# Patient Record
Sex: Male | Born: 1955 | Race: White | Hispanic: No | Marital: Married | Smoking: Never smoker
Health system: Southern US, Community
[De-identification: ages and names within clinical notes are randomized; demographics above are authoritative.]

## PROBLEM LIST (undated history)

## (undated) DIAGNOSIS — I493 Ventricular premature depolarization: Secondary | ICD-10-CM

## (undated) DIAGNOSIS — I10 Essential (primary) hypertension: Secondary | ICD-10-CM

## (undated) DIAGNOSIS — M810 Age-related osteoporosis without current pathological fracture: Secondary | ICD-10-CM

## (undated) HISTORY — DX: Ventricular premature depolarization: I49.3

## (undated) HISTORY — DX: Essential (primary) hypertension: I10

## (undated) HISTORY — DX: Age-related osteoporosis without current pathological fracture: M81.0

---
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
## 2008-07-26 ENCOUNTER — Ambulatory Visit: Payer: Self-pay | Admitting: Gastroenterology

## 2016-04-23 ENCOUNTER — Other Ambulatory Visit: Payer: Self-pay | Admitting: Orthopedic Surgery

## 2016-04-23 DIAGNOSIS — M5416 Radiculopathy, lumbar region: Secondary | ICD-10-CM

## 2016-04-25 ENCOUNTER — Ambulatory Visit
Admission: RE | Admit: 2016-04-25 | Discharge: 2016-04-25 | Disposition: A | Discharge status: Home / Self Care | Payer: BLUE CROSS/BLUE SHIELD | Source: Ambulatory Visit | Attending: Orthopedic Surgery | Admitting: Orthopedic Surgery

## 2016-04-25 DIAGNOSIS — M5126 Other intervertebral disc displacement, lumbar region: Secondary | ICD-10-CM | POA: Insufficient documentation

## 2016-04-25 DIAGNOSIS — M5416 Radiculopathy, lumbar region: Secondary | ICD-10-CM | POA: Diagnosis present

## 2017-04-09 ENCOUNTER — Other Ambulatory Visit: Payer: Self-pay

## 2017-04-09 DIAGNOSIS — Z0000 Encounter for general adult medical examination without abnormal findings: Secondary | ICD-10-CM

## 2017-04-09 LAB — POCT URINALYSIS DIPSTICK
Bilirubin, UA: NEGATIVE
Blood, UA: NEGATIVE
GLUCOSE UA: NEGATIVE
Ketones, UA: NEGATIVE
Leukocytes, UA: NEGATIVE
Nitrite, UA: NEGATIVE
Spec Grav, UA: 1.015 (ref 1.010–1.025)
UROBILINOGEN UA: 0.2 U/dL
pH, UA: 6.5 (ref 5.0–8.0)

## 2017-04-11 LAB — CMP12+LP+TP+TSH+6AC+PSA+CBC…
A/G RATIO: 1.9 (ref 1.2–2.2)
ALBUMIN: 4.4 g/dL (ref 3.6–4.8)
ALK PHOS: 72 IU/L (ref 39–117)
ALT: 17 IU/L (ref 0–44)
AST: 19 IU/L (ref 0–40)
BASOS ABS: 0.1 10*3/uL (ref 0.0–0.2)
BUN/Creatinine Ratio: 17 (ref 10–24)
BUN: 17 mg/dL (ref 8–27)
Basos: 1 %
Bilirubin Total: 1.7 mg/dL — ABNORMAL HIGH (ref 0.0–1.2)
CALCIUM: 9.4 mg/dL (ref 8.6–10.2)
CHOLESTEROL TOTAL: 152 mg/dL (ref 100–199)
Chloride: 102 mmol/L (ref 96–106)
Chol/HDL Ratio: 3.4 ratio (ref 0.0–5.0)
Creatinine, Ser: 0.99 mg/dL (ref 0.76–1.27)
EOS (ABSOLUTE): 0.2 10*3/uL (ref 0.0–0.4)
Eos: 3 %
Estimated CHD Risk: 0.5 times avg. (ref 0.0–1.0)
Free Thyroxine Index: 2.3 (ref 1.2–4.9)
GFR calc Af Amer: 94 mL/min/{1.73_m2} (ref >59)
GFR calc non Af Amer: 81 mL/min/{1.73_m2} (ref >59)
GGT: 15 IU/L (ref 0–65)
GLOBULIN, TOTAL: 2.3 g/dL (ref 1.5–4.5)
Glucose: 113 mg/dL — ABNORMAL HIGH (ref 65–99)
HDL: 45 mg/dL (ref >39)
HEMOGLOBIN: 16.1 g/dL (ref 13.0–17.7)
Hematocrit: 48 % (ref 37.5–51.0)
IMMATURE GRANULOCYTES: 0 %
Immature Grans (Abs): 0 10*3/uL (ref 0.0–0.1)
Iron: 118 ug/dL (ref 38–169)
LDH: 159 IU/L (ref 121–224)
LDL CALC: 92 mg/dL (ref 0–99)
LYMPHS ABS: 1.6 10*3/uL (ref 0.7–3.1)
Lymphs: 25 %
MCH: 31.1 pg (ref 26.6–33.0)
MCHC: 33.5 g/dL (ref 31.5–35.7)
MCV: 93 fL (ref 79–97)
MONOS ABS: 0.7 10*3/uL (ref 0.1–0.9)
Monocytes: 10 %
NEUTROS PCT: 61 %
Neutrophils Absolute: 4 10*3/uL (ref 1.4–7.0)
PLATELETS: 271 10*3/uL (ref 150–379)
PROSTATE SPECIFIC AG, SERUM: 2.3 ng/mL (ref 0.0–4.0)
Phosphorus: 3.3 mg/dL (ref 2.5–4.5)
Potassium: 4.5 mmol/L (ref 3.5–5.2)
RBC: 5.18 x10E6/uL (ref 4.14–5.80)
RDW: 13.7 % (ref 12.3–15.4)
Sodium: 139 mmol/L (ref 134–144)
T3 Uptake Ratio: 30 % (ref 24–39)
T4 TOTAL: 7.7 ug/dL (ref 4.5–12.0)
TRIGLYCERIDES: 75 mg/dL (ref 0–149)
TSH: 1.65 u[IU]/mL (ref 0.450–4.500)
Total Protein: 6.7 g/dL (ref 6.0–8.5)
Uric Acid: 5.1 mg/dL (ref 3.7–8.6)
VLDL Cholesterol Cal: 15 mg/dL (ref 5–40)
WBC: 6.6 10*3/uL (ref 3.4–10.8)

## 2017-04-11 LAB — VITAMIN D 25 HYDROXY (VIT D DEFICIENCY, FRACTURES): Vit D, 25-Hydroxy: 37.8 ng/mL (ref 30.0–100.0)

## 2017-04-23 ENCOUNTER — Encounter: Payer: Self-pay | Admitting: Medical

## 2017-04-23 ENCOUNTER — Ambulatory Visit: Payer: Self-pay | Admitting: Medical

## 2017-04-23 VITALS — BP 145/85 | HR 61 | Temp 98.1°F | Resp 16 | Ht 72.0 in | Wt 172.0 lb

## 2017-04-23 DIAGNOSIS — R17 Unspecified jaundice: Secondary | ICD-10-CM

## 2017-04-23 DIAGNOSIS — Z0000 Encounter for general adult medical examination without abnormal findings: Secondary | ICD-10-CM

## 2017-04-23 DIAGNOSIS — N5089 Other specified disorders of the male genital organs: Secondary | ICD-10-CM

## 2017-04-23 DIAGNOSIS — R7301 Impaired fasting glucose: Secondary | ICD-10-CM

## 2017-04-23 NOTE — Patient Instructions (Addendum)
Schedule eye exam,  Take  Vit  D3 2000IU /day recheck in  3-6 months. Needs A1 C checked.Labs in 4 weeks for liver.      Vitamin D Deficiency Vitamin D deficiency is when your body does not have enough vitamin D. Vitamin D is important because:  It helps your body use other minerals that your body needs.  It helps keep your bones strong and healthy.  It may help to prevent some diseases.  It helps your heart and other muscles work well.  You can get vitamin D by:  Eating foods with vitamin D in them.  Drinking or eating milk or other foods that have had vitamin D added to them.  Taking a vitamin D supplement.  Being in the sun.  Not getting enough vitamin D can make your bones become soft. It can also cause other health problems. Follow these instructions at home:  Take medicines and supplements only as told by your doctor.  Eat foods that have vitamin D. These include: ? Dairy products, cereals, or juices with added vitamin D. Check the label for vitamin D. ? Fatty fish like salmon or trout. ? Eggs. ? Oysters.  Do not use tanning beds.  Stay at a healthy weight. Lose weight, if needed.  Keep all follow-up visits as told by your doctor. This is important. Contact a doctor if:  Your symptoms do not go away.  You feel sick to your stomach (nauseous).  Youthrow up (vomit).  You poop less often than usual or you have trouble pooping (constipation). This information is not intended to replace advice given to you by your health care provider. Make sure you discuss any questions you have with your health care provider. Document Released: 08/30/2011 Document Revised: 02/16/2016 Document Reviewed: 01/26/2015 Elsevier Interactive Patient Education  [employer].

## 2017-04-23 NOTE — Progress Notes (Signed)
Subjective:    Patient ID: Johnny Palmer, male    DOB: 12-Jun-1956, 61 y.o.   MRN: [id number]  HPI 61 yo male here for primary care appointment. 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   HENT: Negative.   Eyes: Negative.   Respiratory: Negative.   Cardiovascular: Negative.   Gastrointestinal: Negative.   Endocrine: Negative.   Genitourinary: Negative.   Musculoskeletal: Negative.   Skin: Negative.   Allergic/Immunologic: Positive for environmental allergies.  Neurological: Negative.   Hematological: Bruises/bleeds easily.  Psychiatric/Behavioral: Negative.    2 wks in the spring has allergy symptoms.  Current Outpatient Prescriptions:  .  aspirin 81 MG chewable tablet, Chew by mouth daily., Disp: , Rfl:  .  calcium carbonate (OSCAL) 1500 (600 Ca) MG TABS tablet, Take by mouth 2 (two) times daily with a meal., Disp: , Rfl:  .  folic acid (FOLVITE) 1 MG tablet, Take 1 mg by mouth daily., Disp: , Rfl:     Objective:   Physical Exam  Constitutional: He is oriented to person, place, and time. Vital signs are normal. He appears well-developed and well-nourished.  HENT:  Head: Normocephalic and atraumatic.  Right Ear: Hearing, tympanic membrane, external ear and ear canal normal.  Left Ear: Hearing, tympanic membrane, external ear and ear canal normal.  Nose: Nose normal.  Mouth/Throat: Uvula is midline, oropharynx is clear and moist and mucous membranes are normal.  Eyes: Pupils are equal, round, and reactive to light. Conjunctivae, EOM and lids are normal.  Fundoscopic exam:      The right eye shows red reflex.       The left eye shows red reflex.  Neck: Trachea normal and normal range of motion. Neck supple. Carotid bruit is not present. No thyromegaly present.  Cardiovascular: Normal rate, regular rhythm, normal heart sounds and intact distal pulses.  Exam reveals no gallop and  no friction rub.   No murmur heard. Pulses:      Carotid pulses are 2+ on the right side, and 2+ on the left side.      Radial pulses are 2+ on the right side, and 2+ on the left side.       Popliteal pulses are 2+ on the right side, and 2+ on the left side.       Dorsalis pedis pulses are 2+ on the right side, and 2+ on the left side.       Posterior tibial pulses are 2+ on the right side, and 2+ on the left side.  Pulmonary/Chest: Effort normal and breath sounds normal.  Abdominal: Soft. Normal aorta and bowel sounds are normal. There is no hepatosplenomegaly. Hernia confirmed negative in the right inguinal area and confirmed negative in the left inguinal area.  Genitourinary: Penis normal.    Circumcised.  Lymphadenopathy:    He has no cervical adenopathy.       Right: No inguinal, no supraclavicular and no epitrochlear adenopathy present.       Left: No inguinal, no supraclavicular and no epitrochlear adenopathy present.  Neurological: He is alert and oriented to person, place, and time. He has normal strength. No cranial nerve deficit or sensory deficit. He displays a negative Romberg sign. GCS eye subscore is 4. GCS verbal subscore is 5. GCS motor subscore is 6.  Reflex Scores:      Brachioradialis reflexes are 2+ on the right side and 2+ on  the left side.      Patellar reflexes are 2+ on the right side and 2+ on the left side.      Achilles reflexes are 2+ on the right side and 2+ on the left side. Skin: Skin is warm, dry and intact.  Psychiatric: He has a normal mood and affect. His speech is normal and behavior is normal. Judgment and thought content normal. Cognition and memory are normal.  Nursing note and vitals reviewed.  Right testicle smaller than left. Due for  colonoscopey at  43 yold Td needed from [facility] Billirubin recheck, A1C ahnd Hep c and HIV to be checked. Protein in urine recheck in 6 months      Assessment & Plan:  Primary care appointment- reviewed  labs with patient. Today recheck of Bilirubin due to elevation, A1C due to elevated fasting glucose, check  Hep C and HIV. Urine protein, recheck in  6 months. Will discuss right testicle size with Dr. Rosanna Randy. Recommend Vit D3  2000 IU/day and recheck in 3 months.

## 2017-05-21 ENCOUNTER — Other Ambulatory Visit: Payer: Self-pay

## 2017-05-21 DIAGNOSIS — Z0000 Encounter for general adult medical examination without abnormal findings: Secondary | ICD-10-CM

## 2017-05-21 DIAGNOSIS — R17 Unspecified jaundice: Secondary | ICD-10-CM

## 2017-05-21 DIAGNOSIS — R7301 Impaired fasting glucose: Secondary | ICD-10-CM

## 2017-05-22 LAB — HGB A1C W/O EAG: Hgb A1c MFr Bld: 5.8 % — ABNORMAL HIGH (ref 4.8–5.6)

## 2017-05-22 LAB — HEPATIC FUNCTION PANEL
ALBUMIN: 4.2 g/dL (ref 3.6–4.8)
ALT: 17 IU/L (ref 0–44)
AST: 16 IU/L (ref 0–40)
Alkaline Phosphatase: 71 IU/L (ref 39–117)
Bilirubin Total: 1.8 mg/dL — ABNORMAL HIGH (ref 0.0–1.2)
Bilirubin, Direct: 0.34 mg/dL (ref 0.00–0.40)
Total Protein: 6.5 g/dL (ref 6.0–8.5)

## 2017-05-22 LAB — HIV ANTIBODY (ROUTINE TESTING W REFLEX): HIV SCREEN 4TH GENERATION: NONREACTIVE

## 2017-05-22 LAB — HCV COMMENT:

## 2017-05-22 LAB — HEPATITIS C ANTIBODY (REFLEX)

## 2017-05-23 ENCOUNTER — Telehealth: Payer: Self-pay | Admitting: Medical

## 2017-05-23 DIAGNOSIS — R17 Unspecified jaundice: Secondary | ICD-10-CM

## 2017-05-23 DIAGNOSIS — E559 Vitamin D deficiency, unspecified: Secondary | ICD-10-CM

## 2017-05-23 DIAGNOSIS — R7303 Prediabetes: Secondary | ICD-10-CM

## 2017-05-23 DIAGNOSIS — N5089 Other specified disorders of the male genital organs: Secondary | ICD-10-CM

## 2017-05-23 NOTE — Telephone Encounter (Signed)
Patient returned my call to review labs.  A1C  5.8 prediabetic will schedule appointment with dietitian.  Bili 1.8  Will recheck hepatic panel in  3 months.  Patient taking  Vit D3 2000IU / d will recheck in  3 months.   Reviewed right testicle size with Dr. Rosanna Randy, smalller than the left recommend  Testicular U/S.

## 2017-05-26 ENCOUNTER — Encounter: Payer: Self-pay | Admitting: Medical

## 2017-05-27 NOTE — Telephone Encounter (Signed)
  Reviewed labs HIV/ Hep C negative, Predabetic at 5.8 will refer to [facility] May for diet counseling.  Reviewed with Dr. Rosanna Randy  Bilirubin 1.8   recheck in 3 months Will also check Vit D level he is currently now taking 2000IU/day.   Reviewed with Dr. Rosanna Randy testicle size of the right  (smaller than the left side)  Recommends testicular ultrasound.   Patient states Thursdays or Fridays work best for him for an appointment.  As side a note patientt tried Vit D3 and felt sick so he stopped it , he then felt he had like a 24 hour "flu". After he felt better he restarted the Vitamin D and has had no problems since.

## 2017-05-28 ENCOUNTER — Telehealth: Payer: Self-pay | Admitting: Medical

## 2017-05-28 DIAGNOSIS — N5089 Other specified disorders of the male genital organs: Secondary | ICD-10-CM

## 2017-05-28 NOTE — Telephone Encounter (Signed)
Reordered test [place] arat/ven flow abd/pelv doppler  img [id number] for testicular u/s. Cancelled original test.

## 2017-05-30 ENCOUNTER — Ambulatory Visit: Payer: BLUE CROSS/BLUE SHIELD

## 2017-06-05 ENCOUNTER — Ambulatory Visit
Admission: RE | Admit: 2017-06-05 | Discharge: 2017-06-05 | Disposition: A | Discharge status: Home / Self Care | Payer: BLUE CROSS/BLUE SHIELD | Source: Ambulatory Visit | Attending: Medical | Admitting: Medical

## 2017-06-05 DIAGNOSIS — N503 Cyst of epididymis: Secondary | ICD-10-CM | POA: Insufficient documentation

## 2017-06-05 DIAGNOSIS — N5089 Other specified disorders of the male genital organs: Secondary | ICD-10-CM | POA: Diagnosis not present

## 2017-06-05 DIAGNOSIS — N433 Hydrocele, unspecified: Secondary | ICD-10-CM | POA: Insufficient documentation

## 2017-06-11 ENCOUNTER — Telehealth: Payer: Self-pay | Admitting: Medical

## 2017-06-11 NOTE — Telephone Encounter (Signed)
Called patient on 06/10/17 at 5:15pm to review testicular ultrasound reports. All within normal limits.  Reviewed epididymal cysts and hydroceles with patient , small and nothing to do about them now. If he has any problems/pain or concerns to return to the clinic.   He stated he has started taking Vitamin D 3 and is feeling great.

## 2017-06-12 ENCOUNTER — Ambulatory Visit: Payer: Self-pay | Admitting: Dietician

## 2017-06-12 NOTE — Progress Notes (Unsigned)
Nutrition: 06/12/2017  CC: Had blood work done in June and has an elevated glucose test that made me in the range of Pre-diabetes.  Assessment:  HT 5;11'' WT: 184 lbs. A1C: 5.8% Fasting Glucose: 113.  Gives history of having a herniated disc approximately 12-18 months ago.  Has not been as physically active.  Has started back at stretching exercises and is back at his recumbent bike.  Trying to take it easy and not re-injury himself.  Currently is at 6 miles as many days per week as possible.  Rides in his home. B: Granola with 2% milk and a 6 oz. Glass of orange juice. L: Leftovers from home and a diet soda.   On Wednesday will have the soup/salas and bread with cobbler and ice cream.  Typically does not eat desserts. D: Pasta with meat a salad and diet ice tea.  Allows himself 2 beers per week.  Drinks diet sosa and Crystal Light ice tea..  Recommendations: 1. Try fruit and /or a smaller amount of orange juice at breakfast. 2. Lunch: add a veggie at lunch for the fiber. 3. Keep doing the diet soda/tea/  Keep the alcohol at current leve.. 4. Advance exercise as tolerated. 6. Make the chocolate, dark chocolate or alternate milk chocolate with dark chocolate.  Plan/Personal Goals: Ratio less than 5.4% and glucose fasting less then 99. Ride bike 4 times per week or 10+ miles. Stretch 5 times per week.  F/U: As needed.  Teaching tools: [employer] handout.  Maggie Manasi Dishon, RN, RD, LDN

## 2017-09-03 ENCOUNTER — Other Ambulatory Visit: Payer: Self-pay

## 2017-09-08 IMAGING — MR MR LUMBAR SPINE W/O CM
5 series · 38 of 48 positions shown · non-contrast
Comparison: None.

CLINICAL DATA: Acute lumbar radiculopathy. Back pain with left leg
pain

EXAM:
MRI LUMBAR SPINE WITHOUT CONTRAST
TECHNIQUE: Multiplanar, multisequence MR imaging of the lumbar spine was
performed. No intravenous contrast was administered.

[Series 2: T2 · sagittal · 4.0mm · 0.81mm/px · 6 of 15 slices shown (1 of 2)]
[im 1/15]
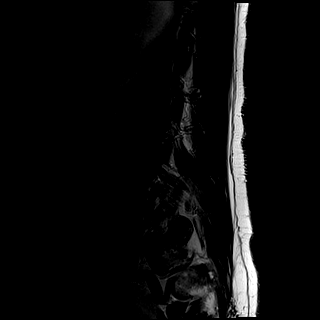
[im 3/15]
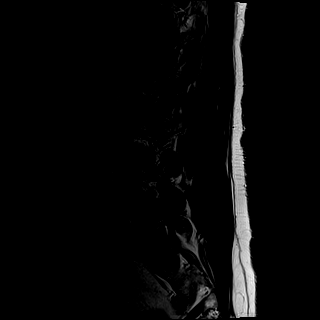
[im 6/15]
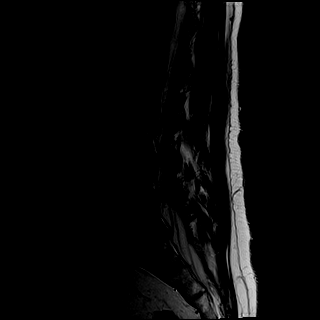
[im 9/15]
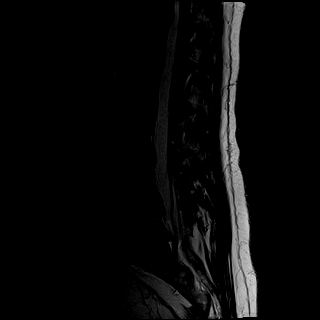
[im 12/15]
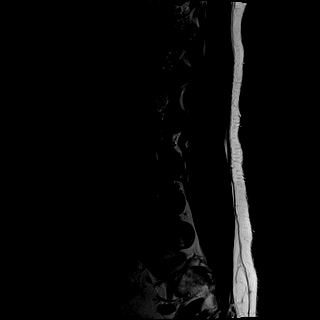
[im 15/15]
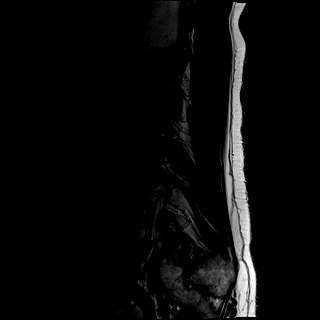

[Series 3: T1 · sagittal · 4.0mm · 0.81mm/px · 6 of 15 slices shown (1 of 2)]
[im 1/15]
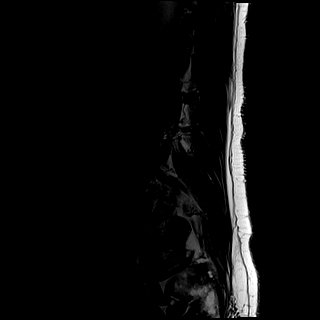
[im 3/15]
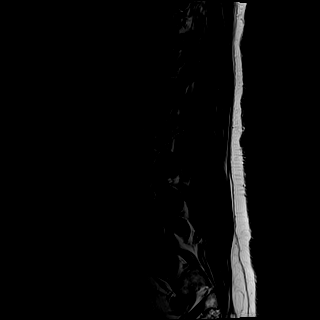
[im 6/15]
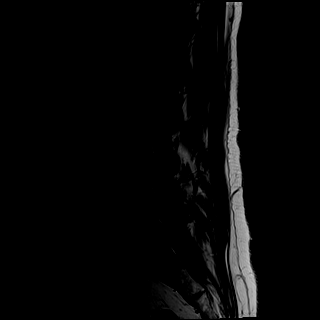
[im 9/15]
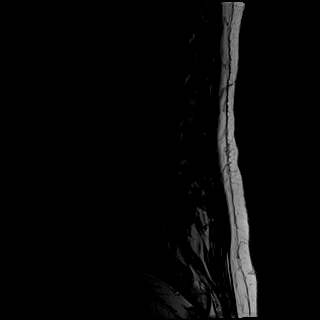
[im 12/15]
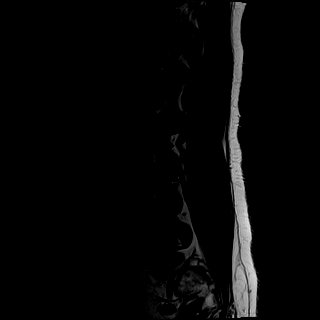
[im 15/15]
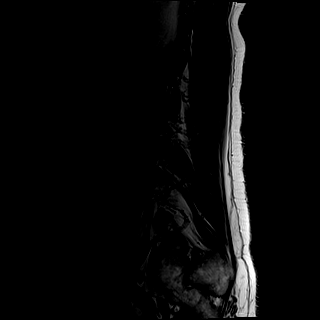

[Series 4: STIR · sagittal · 4.0mm · 1.02mm/px · 6 of 15 slices shown]
[im 1/15]
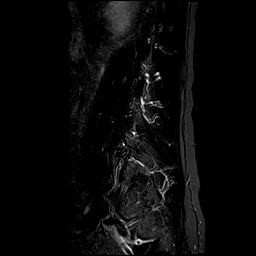
[im 3/15]
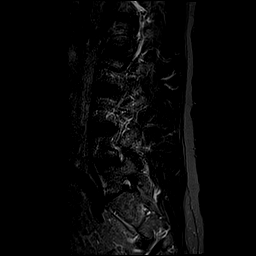
[im 6/15]
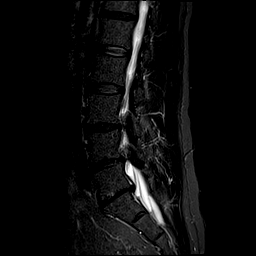
[im 9/15]
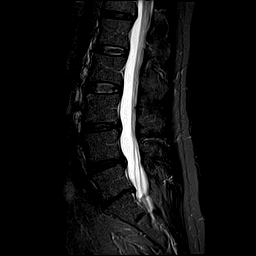
[im 12/15]
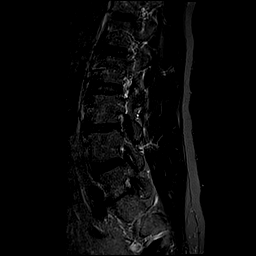
[im 15/15]
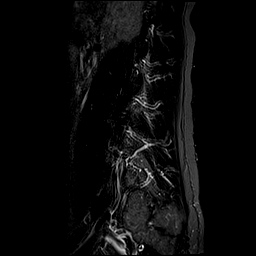

[Series 5: T2 · axial · 4.0mm · 0.78mm/px · z∈[-155,+70]mm · 11 of 41 slices shown (2 of 2)]
[im 1/41]
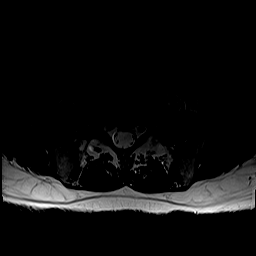
[im 3/41]
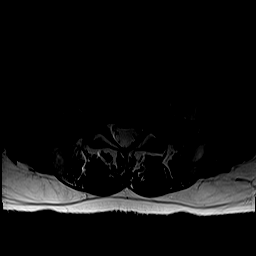
[im 6/41]
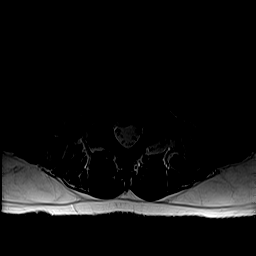
[im 9/41]
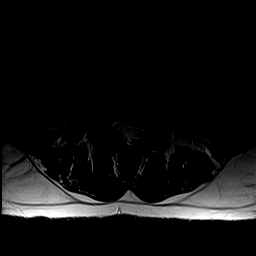
[im 12/41]
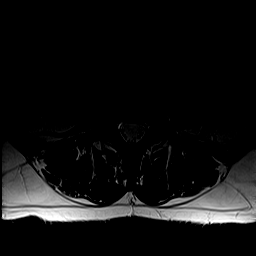
[im 18/41]
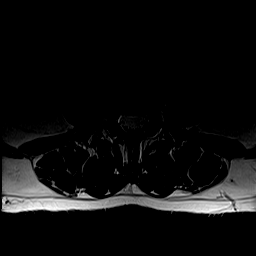
[im 21/41]
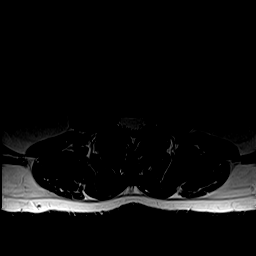
[im 23/41]
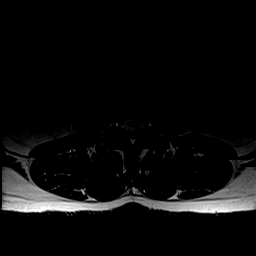
[im 29/41]
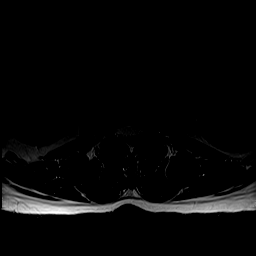
[im 35/41]
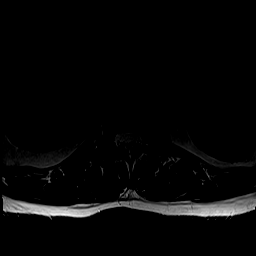
[im 41/41]
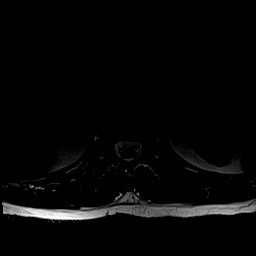

[Series 6: T1 · axial · 4.0mm · 0.39mm/px · z∈[-155,+70]mm · 9 of 41 slices shown (2 of 2)]
[im 1/41]
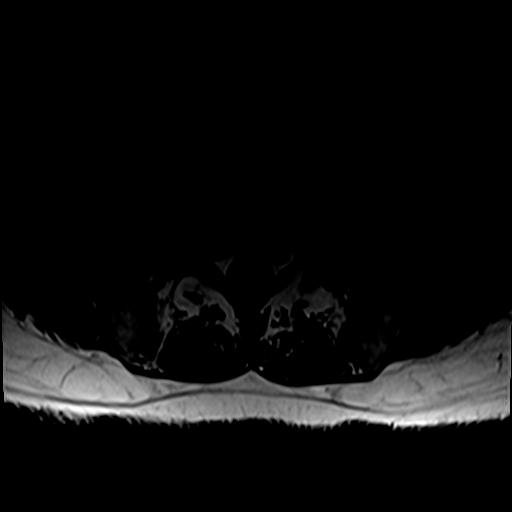
[im 6/41]
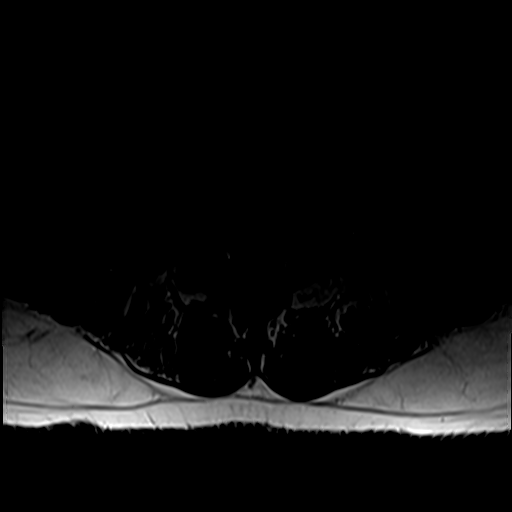
[im 12/41]
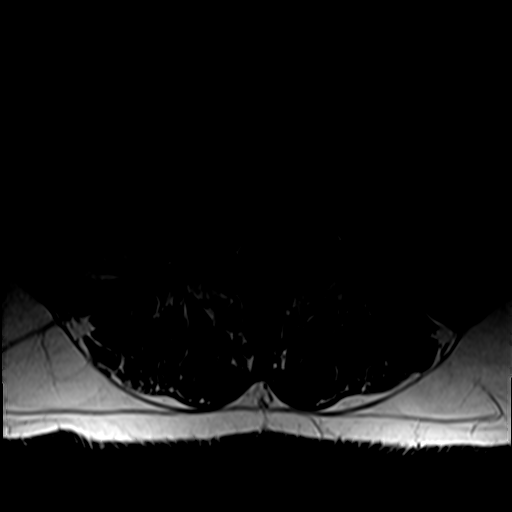
[im 18/41]
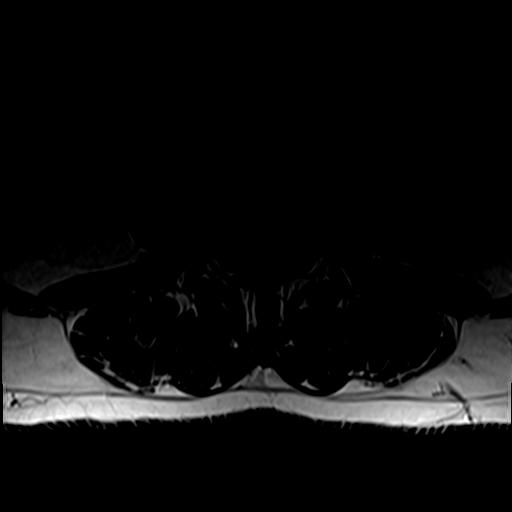
[im 21/41]
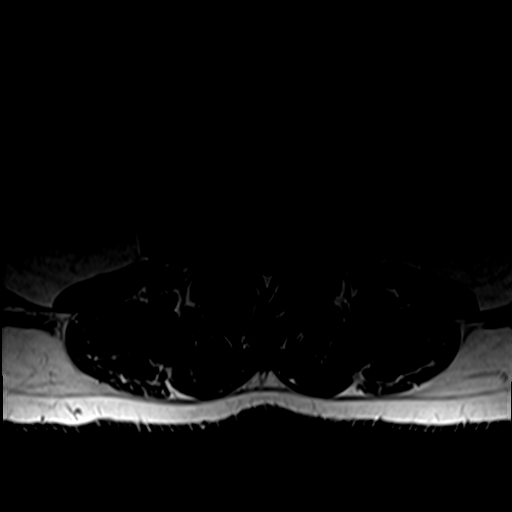
[im 23/41]
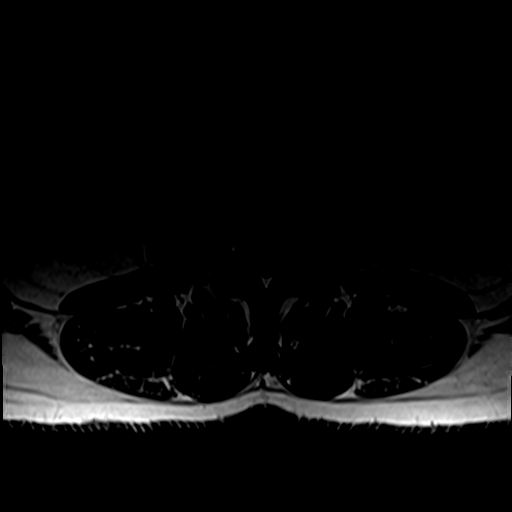
[im 29/41]
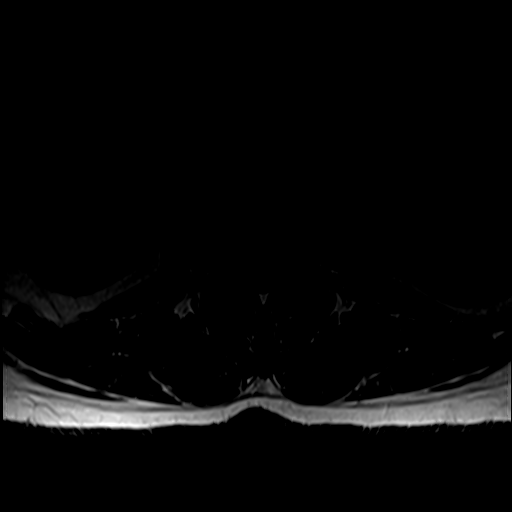
[im 35/41]
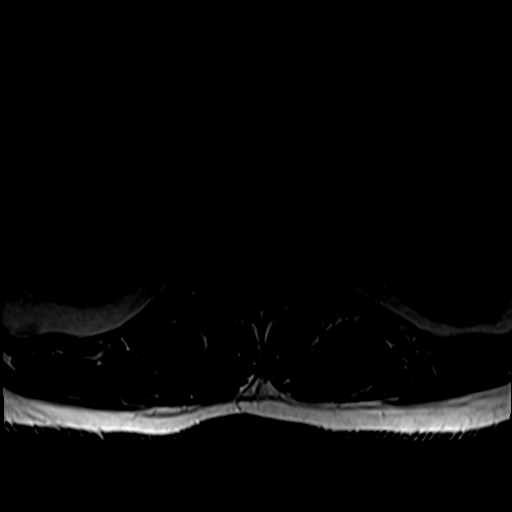
[im 41/41]
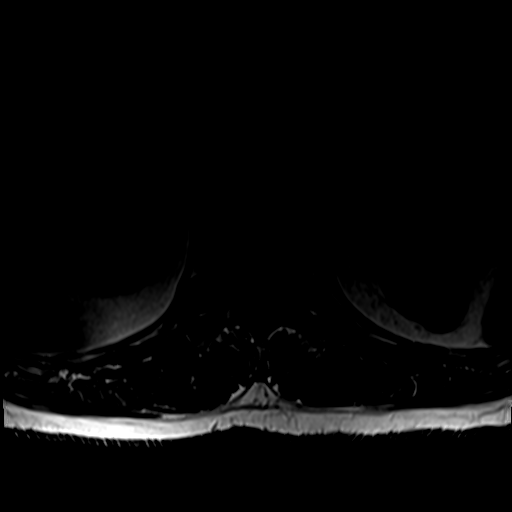

[38 of 48 positions shown; findings below may reference images not displayed]

FINDINGS: Segmentation:  Normal

Alignment:  Normal

Vertebrae:  Negative for fracture or mass lesion.

Conus medullaris: Extends to the L1-2 level and appears normal.

Paraspinal and other soft tissues: Paraspinous muscles are normal
and symmetric. No retroperitoneal mass or adenopathy.

Disc levels:

L1-2:  Negative

L2-3:  Mild disc degeneration.  Negative for spinal stenosis

L3-4: Disc degeneration and disc space narrowing. Diffuse bulging of
the disc with mild narrowing of the canal. No significant spinal or
foraminal stenosis

L4-5: Extruded disc fragment on the left with downgoing disc
material which is compressing the left L5 nerve root. Moderately
large disc fragment is noted. Diffuse disc bulging. No significant
spinal stenosis.

L5-S1: Moderate to advanced disc space narrowing. Mild disc bulging
and endplate spurring with mild foraminal narrowing bilaterally.
IMPRESSION: Extruded disc fragment on the left with downgoing disc material
compressing the left L5 nerve root. Moderately large disc fragment.

Lumbar degenerative changes as above. No other areas of neural
impingement.

## 2017-09-09 ENCOUNTER — Other Ambulatory Visit: Payer: Self-pay

## 2017-09-09 DIAGNOSIS — E559 Vitamin D deficiency, unspecified: Secondary | ICD-10-CM

## 2017-09-09 DIAGNOSIS — R17 Unspecified jaundice: Secondary | ICD-10-CM

## 2017-09-11 LAB — HEPATIC FUNCTION PANEL
ALBUMIN: 4.2 g/dL (ref 3.6–4.8)
ALT: 12 IU/L (ref 0–44)
AST: 14 IU/L (ref 0–40)
Alkaline Phosphatase: 70 IU/L (ref 39–117)
BILIRUBIN TOTAL: 1.5 mg/dL — AB (ref 0.0–1.2)
BILIRUBIN, DIRECT: 0.33 mg/dL (ref 0.00–0.40)
TOTAL PROTEIN: 6.5 g/dL (ref 6.0–8.5)

## 2017-09-11 LAB — VITAMIN D 25 HYDROXY (VIT D DEFICIENCY, FRACTURES): VIT D 25 HYDROXY: 45.3 ng/mL (ref 30.0–100.0)

## 2017-09-12 ENCOUNTER — Telehealth: Payer: Self-pay | Admitting: Adult Health

## 2017-09-12 DIAGNOSIS — R17 Unspecified jaundice: Secondary | ICD-10-CM

## 2017-09-12 NOTE — Telephone Encounter (Signed)
Duplicate created by Epic see other phone call.  No call made on this encounter.

## 2017-09-12 NOTE — Telephone Encounter (Signed)
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  Called patient to discuss and he verbalized understanding of need for repeat labs in 6 months, sooner if any symptoms change or develop. Discussed watching for signs of jaundice such as but not limited to abdominal pain, yellowing skin or yellow conjunctiva. No dietary restrictions. Patient is aware stress such as  missing meals, increased stress, infection, dehydration, and decreased sleep may increase symptoms and these precipitating factors should be reversed.   He is advised to contact the clinic or his Juluis Pitch, MD for appointment should any new symptoms arise.  See labs below.  Patient verbalized understanding of all instructions given and denies any further questions at this time.    Recent Results (from the past 2160 hour(s))  VITAMIN D 25 Hydroxy (Vit-D Deficiency, Fractures)     Status: None   Collection Time: 09/09/17  8:10 AM  Result Value Ref Range   Vit D, 25-Hydroxy 45.3 30.0 - 100.0 ng/mL    Comment: Vitamin D deficiency has been defined by the [place] practice guideline as a level of serum 25-OH vitamin D less than 20 ng/mL (1,2). The Endocrine Society went on to further define vitamin D insufficiency as a level between 21 and 29 ng/mL (2). 1. IOM (Institute of Medicine). 2010. Dietary reference    intakes for calcium and D. [place]: The    [employer]. 2. Holick MF, Binkley , Bischoff-Ferrari HA, et al.    Evaluation, treatment, and prevention of vitamin D    deficiency: an Endocrine Society clinical practice    guideline. JCEM. 2011 Jul; 96(7):1911-30.   Hepatic function panel     Status: Abnormal   Collection Time: 09/09/17  8:10 AM  Result Value Ref Range   Total Protein 6.5 6.0 - 8.5 g/dL   Albumin  4.2 3.6 - 4.8 g/dL   Bilirubin Total 1.5 (H) 0.0 - 1.2 mg/dL   Bilirubin, Direct 0.33 0.00 - 0.40 mg/dL   Alkaline Phosphatase 70 39 - 117 IU/L   AST 14 0 - 40 IU/L   ALT 12 0 - 44 IU/L

## 2017-09-12 NOTE — Telephone Encounter (Signed)
Error duplicate - epic issues

## 2017-10-09 ENCOUNTER — Other Ambulatory Visit: Payer: Self-pay | Admitting: Medical

## 2017-10-09 ENCOUNTER — Telehealth: Payer: Self-pay | Admitting: Medical

## 2017-10-09 DIAGNOSIS — R17 Unspecified jaundice: Secondary | ICD-10-CM
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
## 2017-10-10 ENCOUNTER — Telehealth: Payer: Self-pay | Admitting: Medical

## 2017-10-10 DIAGNOSIS — R7309 Other abnormal glucose: Secondary | ICD-10-CM

## 2017-10-10 NOTE — Telephone Encounter (Signed)
  Called patient apoke with him.  No A1C was ordered, he will set up an appointment in the next 2 weeks he says.  Fine with me.  Will call him with the results.

## 2017-10-10 NOTE — Telephone Encounter (Signed)
Repeat note , did not save for some reason.  Called patient on  10/08/17 left message.  Returned his call yesterday  10/09/2017.  Patient wanted to know his  A1C from December,  No A1C was checked. Or placed in the orders. Edmonia Lynch Hepatic panel and Vit D level. He was called but he did not remember taking phone conversation, when I reminded him what was talked about ( Gilberts syndrome) he did remember the conversation.   Will do some research and make sure that the A1C was not drawn and call patient back.  He says I may leave a message on his phone.

## 2017-11-05 ENCOUNTER — Other Ambulatory Visit: Payer: Self-pay

## 2017-11-05 DIAGNOSIS — R7309 Other abnormal glucose: Secondary | ICD-10-CM

## 2017-11-06 LAB — HGB A1C W/O EAG: HEMOGLOBIN A1C: 5.6 % (ref 4.8–5.6)

## 2017-11-06 NOTE — Progress Notes (Signed)
Please call patient and let him know his A1C is within normal limits  5.6 and he is doing a good job on exercising and dieting and to keep up his plan. TY

## 2017-11-11 ENCOUNTER — Telehealth: Payer: Self-pay | Admitting: *Deleted

## 2018-03-17 DIAGNOSIS — D239 Other benign neoplasm of skin, unspecified: Secondary | ICD-10-CM

## 2018-03-17 HISTORY — DX: Other benign neoplasm of skin, unspecified: D23.9

## 2019-02-05 IMAGING — US US SCROTUM
1 series · 14 of 25 positions shown · non-contrast
Comparison: None.

CLINICAL DATA: Initial evaluation for small right testicle.

EXAM:
SCROTAL ULTRASOUND
DOPPLER ULTRASOUND OF THE TESTICLES
TECHNIQUE: Complete ultrasound examination of the testicles, epididymis, and
other scrotal structures was performed. Color and spectral Doppler
ultrasound were also utilized to evaluate blood flow to the
testicles.

[Series 1: us scrotum · 0.07mm/px · 14 of 67 slices shown]
[im 1/67]
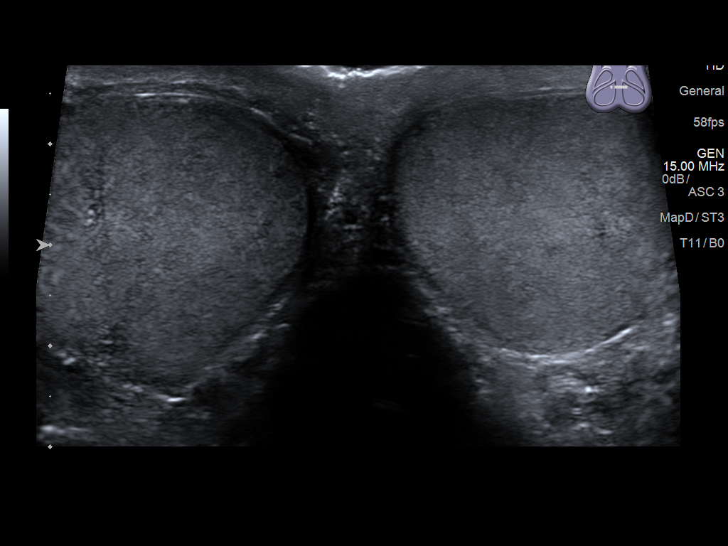
[im 6/67]
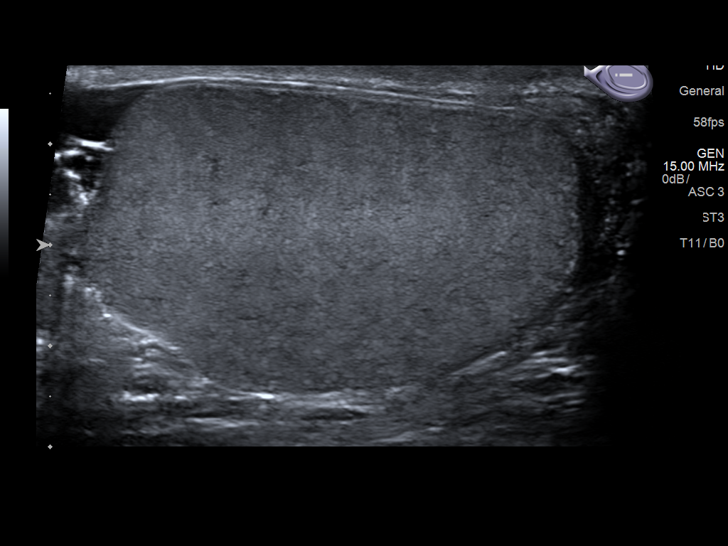
[im 12/67]
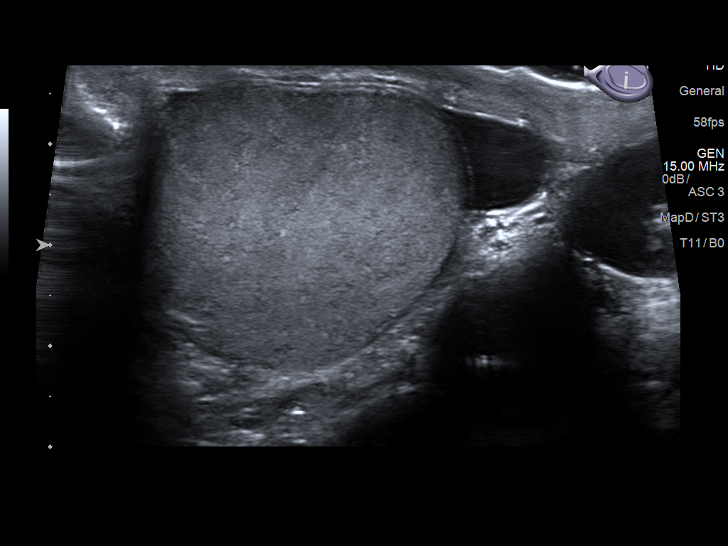
[im 17/67]
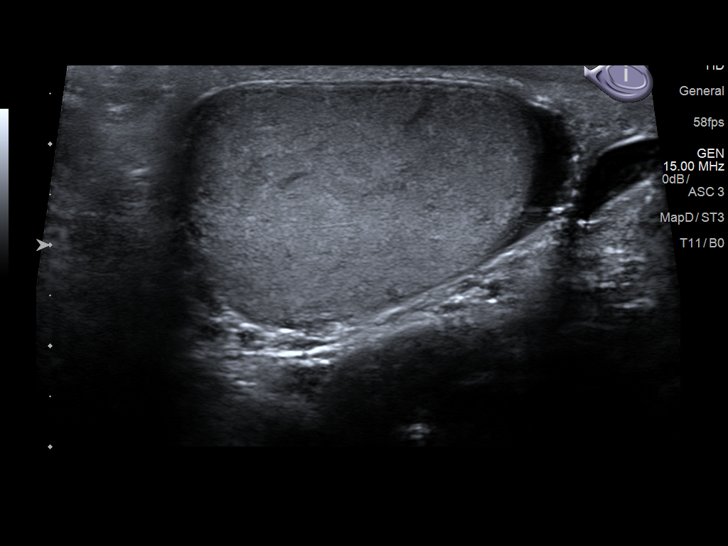
[im 23/67]
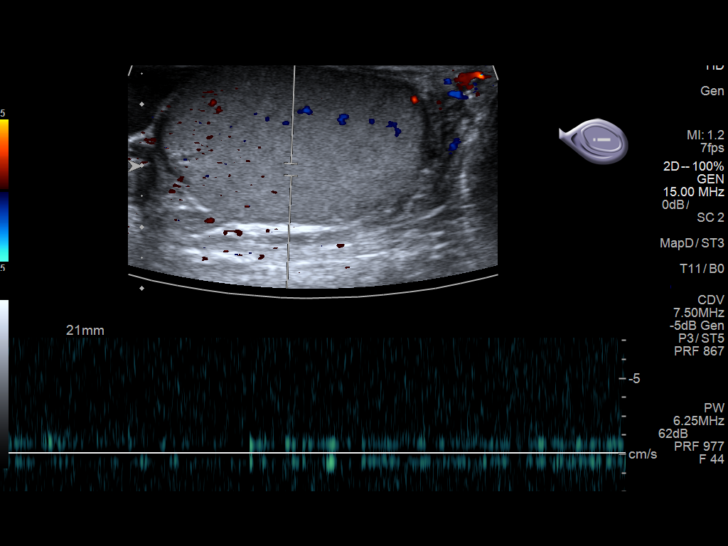
[im 25/67]
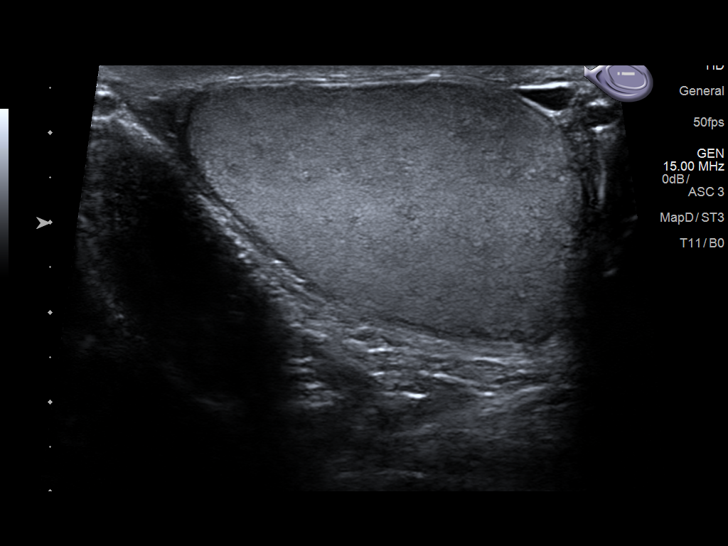
[im 31/67]
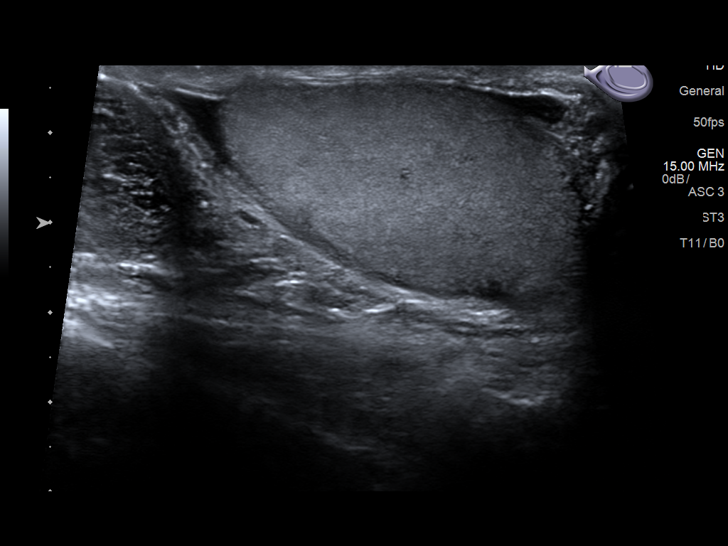
[im 36/67]
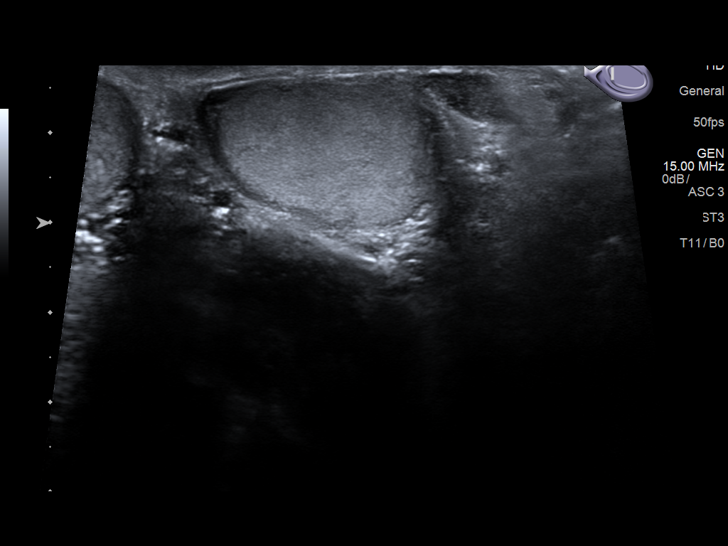
[im 42/67]
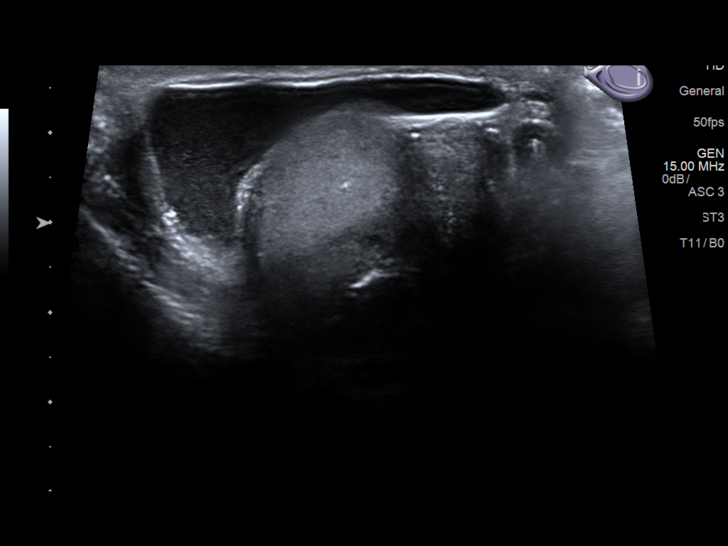
[im 45/67]
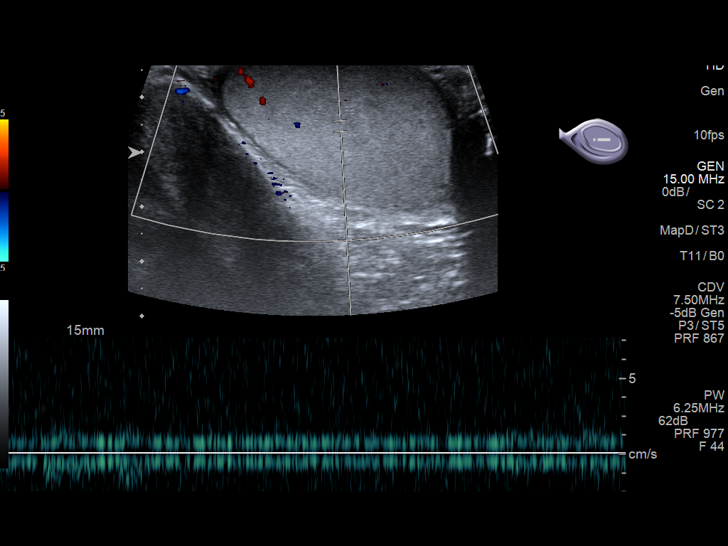
[im 50/67]
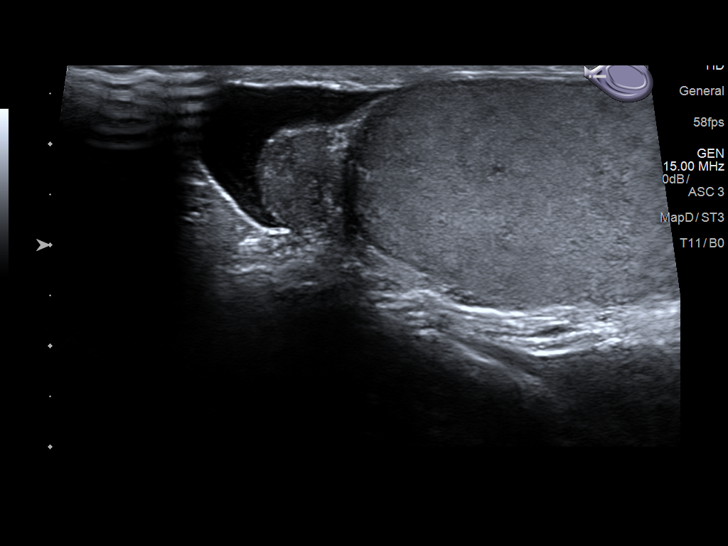
[im 56/67]
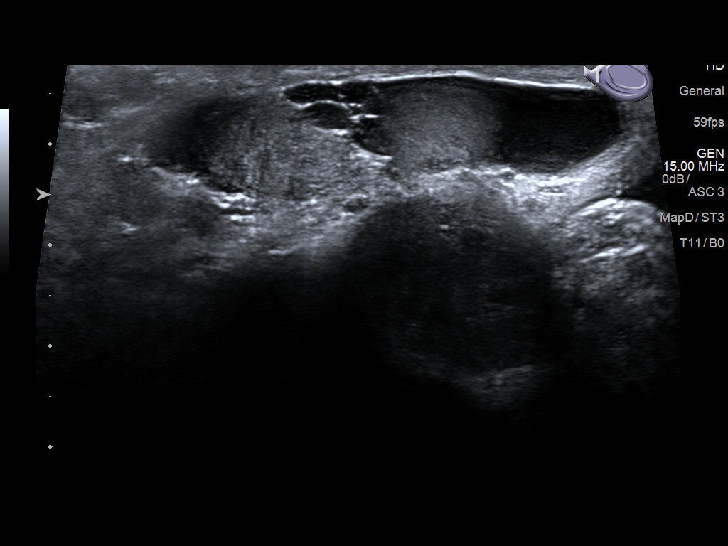
[im 61/67]
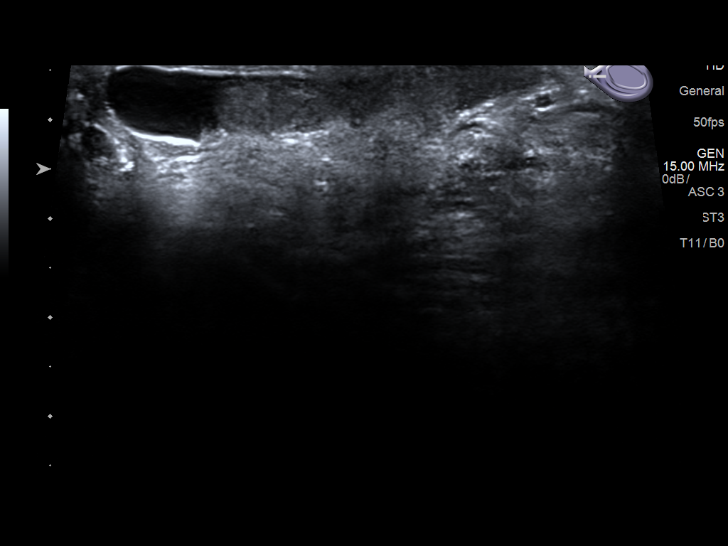
[im 67/67]
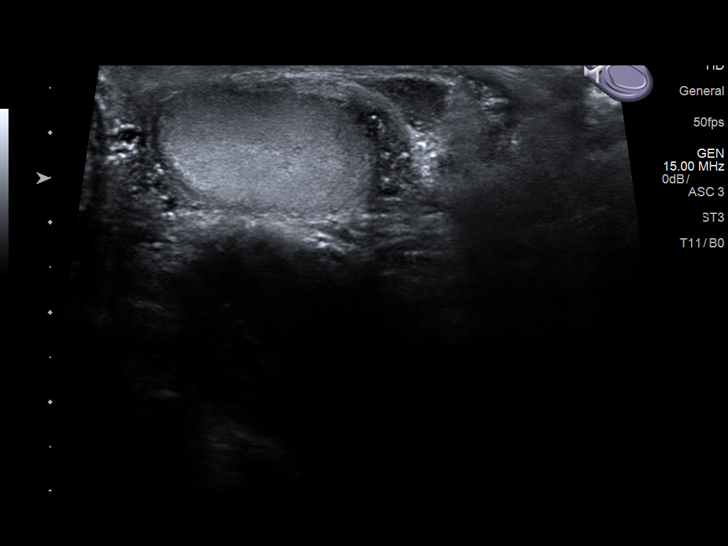

[14 of 25 positions shown; findings below may reference images not displayed]

FINDINGS: Right testicle

Measurements: 4.9 x 2.7 x 3.2 cm. No mass or microlithiasis
visualized.

Left testicle

Measurements: 4.7 x 2.8 x 3.5 cm. No mass or microlithiasis
visualized.

Right epididymis: Normal in size and appearance. 1.2 x 0.5 x 1.2 cm
mildly complex septated cyst.

Left epididymis:  Normal in size and appearance.

Hydrocele:  Small bilateral hydroceles.

Varicocele:  None visualized.

Pulsed Doppler interrogation of both testes demonstrates normal low
resistance arterial and venous waveforms bilaterally.
IMPRESSION: 1. Normal sonographic evaluation of the testicles. Testicles are
symmetric in size as above with normal morphology.
2. Small bilateral hydroceles.
3. 1.2 cm mildly complex right epididymal cyst, felt to be
incidental in nature and of doubtful clinical significance.

## 2019-03-17 ENCOUNTER — Other Ambulatory Visit: Payer: Self-pay | Admitting: Medical

## 2019-03-17 DIAGNOSIS — Z125 Encounter for screening for malignant neoplasm of prostate: Secondary | ICD-10-CM

## 2019-03-17 DIAGNOSIS — Z0000 Encounter for general adult medical examination without abnormal findings: Secondary | ICD-10-CM

## 2019-03-17 DIAGNOSIS — M810 Age-related osteoporosis without current pathological fracture: Secondary | ICD-10-CM

## 2019-03-24 ENCOUNTER — Other Ambulatory Visit: Payer: Self-pay

## 2019-03-24 DIAGNOSIS — Z0000 Encounter for general adult medical examination without abnormal findings: Secondary | ICD-10-CM

## 2019-03-24 DIAGNOSIS — Z125 Encounter for screening for malignant neoplasm of prostate: Secondary | ICD-10-CM

## 2019-03-24 DIAGNOSIS — M810 Age-related osteoporosis without current pathological fracture: Secondary | ICD-10-CM

## 2019-03-24 LAB — POCT URINALYSIS DIPSTICK
Bilirubin, UA: NEGATIVE
Blood, UA: NEGATIVE
Glucose, UA: NEGATIVE
Ketones, UA: NEGATIVE
Leukocytes, UA: NEGATIVE
Nitrite, UA: NEGATIVE
Protein, UA: POSITIVE — AB
Spec Grav, UA: 1.01 (ref 1.010–1.025)
Urobilinogen, UA: 0.2 E.U./dL
pH, UA: 6.5 (ref 5.0–8.0)

## 2019-03-24 NOTE — Addendum Note (Signed)
Addended by: Beckie Salts A on: 03/24/2019 09:03 AM   Modules accepted: Orders

## 2019-03-25 LAB — URINALYSIS, ROUTINE W REFLEX MICROSCOPIC
Bilirubin, UA: NEGATIVE
Glucose, UA: NEGATIVE
Ketones, UA: NEGATIVE
Leukocytes,UA: NEGATIVE
Nitrite, UA: NEGATIVE
Protein,UA: NEGATIVE
RBC, UA: NEGATIVE
Specific Gravity, UA: 1.019 (ref 1.005–1.030)
Urobilinogen, Ur: 0.2 mg/dL (ref 0.2–1.0)
pH, UA: 6 (ref 5.0–7.5)

## 2019-03-25 LAB — CMP12+LP+TP+TSH+6AC+PSA+CBC…
AST: 19 IU/L (ref 0–40)
Albumin: 4.3 g/dL (ref 3.8–4.8)
Alkaline Phosphatase: 68 IU/L (ref 39–117)
BUN: 17 mg/dL (ref 8–27)
Basophils Absolute: 0.1 10*3/uL (ref 0.0–0.2)
Basos: 1 %
Bilirubin Total: 1.6 mg/dL — ABNORMAL HIGH (ref 0.0–1.2)
Cholesterol, Total: 141 mg/dL (ref 100–199)
EOS (ABSOLUTE): 0.2 10*3/uL (ref 0.0–0.4)
Free Thyroxine Index: 1.9 (ref 1.2–4.9)
GFR calc Af Amer: 81 mL/min/{1.73_m2} (ref >59)
GGT: 10 IU/L (ref 0–65)
Globulin, Total: 2.2 g/dL (ref 1.5–4.5)
HDL: 60 mg/dL (ref >39)
Hematocrit: 46.2 % (ref 37.5–51.0)
Immature Granulocytes: 0 %
Iron: 75 ug/dL (ref 38–169)
LDL Calculated: 72 mg/dL (ref 0–99)
Lymphocytes Absolute: 1.6 10*3/uL (ref 0.7–3.1)
Lymphs: 26 %
MCH: 32.2 pg (ref 26.6–33.0)
Monocytes: 10 %
Neutrophils: 60 %
Platelets: 269 10*3/uL (ref 150–450)
Sodium: 139 mmol/L (ref 134–144)
T4, Total: 6.9 ug/dL (ref 4.5–12.0)
TSH: 1.44 u[IU]/mL (ref 0.450–4.500)
Total Protein: 6.5 g/dL (ref 6.0–8.5)
Uric Acid: 5 mg/dL (ref 3.7–8.6)

## 2019-03-25 LAB — CMP12+LP+TP+TSH+6AC+PSA+CBC?
ALT: 16 IU/L (ref 0–44)
Albumin/Globulin Ratio: 2 (ref 1.2–2.2)
BUN/Creatinine Ratio: 15 (ref 10–24)
Calcium: 9.7 mg/dL (ref 8.6–10.2)
Chloride: 106 mmol/L (ref 96–106)
Chol/HDL Ratio: 2.4 ratio (ref 0.0–5.0)
Creatinine, Ser: 1.11 mg/dL (ref 0.76–1.27)
Eos: 3 %
Estimated CHD Risk: <0.5 times avg. (ref 0.0–1.0)
GFR calc non Af Amer: 70 mL/min/{1.73_m2} (ref >59)
Glucose: 109 mg/dL — ABNORMAL HIGH (ref 65–99)
Hemoglobin: 15.6 g/dL (ref 13.0–17.7)
Immature Grans (Abs): 0 10*3/uL (ref 0.0–0.1)
LDH: 154 IU/L (ref 121–224)
MCHC: 33.8 g/dL (ref 31.5–35.7)
MCV: 95 fL (ref 79–97)
Monocytes Absolute: 0.7 10*3/uL (ref 0.1–0.9)
Neutrophils Absolute: 3.8 10*3/uL (ref 1.4–7.0)
Phosphorus: 3.8 mg/dL (ref 2.8–4.1)
Potassium: 4.4 mmol/L (ref 3.5–5.2)
Prostate Specific Ag, Serum: 7.8 ng/mL — ABNORMAL HIGH (ref 0.0–4.0)
RBC: 4.85 x10E6/uL (ref 4.14–5.80)
RDW: 12.4 % (ref 11.6–15.4)
T3 Uptake Ratio: 28 % (ref 24–39)
Triglycerides: 44 mg/dL (ref 0–149)
VLDL Cholesterol Cal: 9 mg/dL (ref 5–40)
WBC: 6.4 10*3/uL (ref 3.4–10.8)

## 2019-03-25 LAB — VITAMIN D 25 HYDROXY (VIT D DEFICIENCY, FRACTURES): Vit D, 25-Hydroxy: 53.1 ng/mL (ref 30.0–100.0)

## 2019-03-25 LAB — HGB A1C W/O EAG: Hgb A1c MFr Bld: 5.5 % (ref 4.8–5.6)

## 2019-03-31 ENCOUNTER — Telehealth: Payer: Self-pay | Admitting: Medical

## 2019-03-31 NOTE — Telephone Encounter (Signed)
LM for patient to return my phone call .  To review labs , PSA elevated will do Urology referral.once I talk to patient.

## 2019-04-01 ENCOUNTER — Telehealth: Payer: Self-pay | Admitting: Medical

## 2019-04-01 DIAGNOSIS — R972 Elevated prostate specific antigen [PSA]: Secondary | ICD-10-CM
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
## 2019-04-08 ENCOUNTER — Encounter: Payer: Self-pay | Admitting: Medical

## 2019-04-08 ENCOUNTER — Other Ambulatory Visit: Payer: Self-pay

## 2019-04-08 ENCOUNTER — Ambulatory Visit: Payer: Self-pay | Admitting: Medical

## 2019-04-08 VITALS — BP 152/80 | HR 68 | Temp 97.4°F | Resp 16 | Ht 71.0 in | Wt 145.0 lb

## 2019-04-08 DIAGNOSIS — Z1211 Encounter for screening for malignant neoplasm of colon: Secondary | ICD-10-CM

## 2019-04-08 DIAGNOSIS — Z23 Encounter for immunization: Secondary | ICD-10-CM

## 2019-04-08 DIAGNOSIS — R030 Elevated blood-pressure reading, without diagnosis of hypertension: Secondary | ICD-10-CM

## 2019-04-08 DIAGNOSIS — Z0000 Encounter for general adult medical examination without abnormal findings: Secondary | ICD-10-CM

## 2019-04-08 DIAGNOSIS — R972 Elevated prostate specific antigen [PSA]: Secondary | ICD-10-CM

## 2019-04-08 NOTE — Patient Instructions (Addendum)
https://www.cdc.gov/vaccines/hcp/vis/vis-statements/tdap.pdf">  Tdap Vaccine (Tetanus, Diphtheria and Pertussis): What You Need to Know 1. Why get vaccinated? Tetanus, diphtheria and pertussis are very serious diseases. Tdap vaccine can protect [place] from these diseases. And, Tdap vaccine given to pregnant women can protect newborn babies against pertussis.Marland Kitchen TETANUS (Lockjaw) is rare in the [place] today. It causes painful muscle tightening and stiffness, usually all over the body.  It can lead to tightening of muscles in the head and neck so you can't open your mouth, swallow, or sometimes even breathe. Tetanus kills about 1 out of 10 people who are infected even after receiving the best medical care. DIPHTHERIA is also rare in the [place] today. It can cause a thick coating to form in the back of the throat.  It can lead to breathing problems, heart failure, paralysis, and death. PERTUSSIS (Whooping Cough) causes severe coughing spells, which can cause difficulty breathing, vomiting and disturbed sleep.  It can also lead to weight loss, incontinence, and rib fractures. Up to 2 in 100 adolescents and 5 in 100 adults with pertussis are hospitalized or have complications, which could include pneumonia or death. These diseases are caused by bacteria. Diphtheria and pertussis are spread from person to person through secretions from coughing or sneezing. Tetanus enters the body through cuts, scratches, or wounds. Before vaccines, as many as 200,000 cases of diphtheria, 200,000 cases of pertussis, and hundreds of cases of tetanus, were reported in the [place] each year. Since vaccination began, reports of cases for tetanus and diphtheria have dropped by about 99% and for pertussis by about 80%. 2. Tdap vaccine Tdap vaccine can protect adolescents and adults from tetanus, diphtheria, and pertussis. One dose of Tdap is routinely given at age 26 or 66. People who did not get Tdap at that age  should get it as soon as possible. Tdap is especially important for healthcare professionals and anyone having close contact with a baby younger than 12 months. Pregnant women should get a dose of Tdap during every pregnancy, to protect the newborn from pertussis. Infants are most at risk for severe, life-threatening complications from pertussis. Another vaccine, called Td, protects against tetanus and diphtheria, but not pertussis. A Td booster should be given every 10 years. Tdap may be given as one of these boosters if you have never gotten Tdap before. Tdap may also be given after a severe cut or burn to prevent tetanus infection. Your doctor or the person giving you the vaccine can give you more information. Tdap may safely be given at the same time as other vaccines. 3. Some people should not get this vaccine  A person who has ever had a life-threatening allergic reaction after a previous dose of any diphtheria, tetanus or pertussis containing vaccine, OR has a severe allergy to any part of this vaccine, should not get Tdap vaccine. Tell the person giving the vaccine about any severe allergies.  Anyone who had coma or long repeated seizures within 7 days after a childhood dose of DTP or DTaP, or a previous dose of Tdap, should not get Tdap, unless a cause other than the vaccine was found. They can still get Td.  Talk to your doctor if you: ? have seizures or another nervous system problem, ? had severe pain or swelling after any vaccine containing diphtheria, tetanus or pertussis, ? ever had a condition called Guillain-Barr Syndrome (GBS), ? aren't feeling well on the day the shot is scheduled. 4. Risks With any medicine, including vaccines,  there is a chance of side effects. These are usually mild and go away on their own. Serious reactions are also possible but are rare. Most people who get Tdap vaccine do not have any problems with it. Mild problems following Tdap (Did not interfere  with activities)  Pain where the shot was given (about 3 in 4 adolescents or 2 in 3 adults)  Redness or swelling where the shot was given (about 1 person in 5)  Mild fever of at least 100.85F (up to about 1 in 25 adolescents or 1 in 100 adults)  Headache (about 3 or 4 people in 10)  Tiredness (about 1 person in 3 or 4)  Nausea, vomiting, diarrhea, stomach ache (up to 1 in 4 adolescents or 1 in 10 adults)  Chills, sore joints (about 1 person in 10)  Body aches (about 1 person in 3 or 4)  Rash, swollen glands (uncommon) Moderate problems following Tdap (Interfered with activities, but did not require medical attention)  Pain where the shot was given (up to 1 in 5 or 6)  Redness or swelling where the shot was given (up to about 1 in 16 adolescents or 1 in 12 adults)  Fever over 102F (about 1 in 100 adolescents or 1 in 250 adults)  Headache (about 1 in 7 adolescents or 1 in 10 adults)  Nausea, vomiting, diarrhea, stomach ache (up to 1 or 3 people in 100)  Swelling of the entire arm where the shot was given (up to about 1 in 500). Severe problems following Tdap (Unable to perform usual activities; required medical attention)  Swelling, severe pain, bleeding and redness in the arm where the shot was given (rare). Problems that could happen after any vaccine:  People sometimes faint after a medical procedure, including vaccination. Sitting or lying down for about 15 minutes can help prevent fainting, and injuries caused by a fall. Tell your doctor if you feel dizzy, or have vision changes or ringing in the ears.  Some people get severe pain in the shoulder and have difficulty moving the arm where a shot was given. This happens very rarely.  Any medication can cause a severe allergic reaction. Such reactions from a vaccine are very rare, estimated at fewer than 1 in a million doses, and would happen within a few minutes to a few hours after the vaccination. As with any medicine,  there is a very remote chance of a vaccine causing a serious injury or death. The safety of vaccines is always being monitored. For more information, visit: http://www.aguilar.org/ 5. What if there is a serious problem? What should I look for?  Look for anything that concerns you, such as signs of a severe allergic reaction, very high fever, or unusual behavior. Signs of a severe allergic reaction can include hives, swelling of the face and throat, difficulty breathing, a fast heartbeat, dizziness, and weakness. These would usually start a few minutes to a few hours after the vaccination. What should I do?  If you think it is a severe allergic reaction or other emergency that can't wait, call 9-1-1 or get the person to the nearest hospital. Otherwise, call your doctor.  Afterward, the reaction should be reported to the Vaccine Adverse Event Reporting System (VAERS). Your doctor might file this report, or you can do it yourself through the VAERS web site at www.vaers.SamedayNews.es, or by calling (678)645-2399. VAERS does not give medical advice. 6. The National Vaccine Injury Compensation Program The [employer] Vaccine Injury Compensation Program (  VICP) is a federal program that was created to compensate people who may have been injured by certain vaccines. Persons who believe they may have been injured by a vaccine can learn about the program and about filing a claim by calling 403-683-4682 or visiting the [place] website at GoldCloset.com.ee. There is a time limit to file a claim for compensation. 7. How can I learn more?  Ask your doctor. He or she can give you the vaccine package insert or suggest other sources of information.  Call your local or state health department.  Contact the Centers for Disease Control and Prevention (CDC): ? Call (415)447-7316 (1-800-CDC-INFO) or ? Visit CDC's website at http://hunter.com/ Vaccine Information Statement Tdap Vaccine (11/17/2013) This  information is not intended to replace advice given to you by your health care provider. Make sure you discuss any questions you have with your health care provider. Document Released: 03/11/2012 Document Revised: 04/28/2018 Document Reviewed: 04/28/2018 Elsevier Interactive Patient Education  [place].  Colonoscopy, Adult, Care After This sheet gives you information about how to care for yourself after your procedure. Your doctor may also give you more specific instructions. If you have problems or questions, call your doctor. What can I expect after the procedure? After the procedure, it is common to have:  A small amount of blood in your poop for 24 hours.  Some gas.  Mild cramping or bloating in your belly. Follow these instructions at home: General instructions  For the first 24 hours after the procedure: ? Do not drive or use machinery. ? Do not sign important documents. ? Do not drink alcohol. ? Do your daily activities more slowly than normal. ? Eat foods that are soft and easy to digest.  Take over-the-counter or prescription medicines only as told by your doctor. To help cramping and bloating:   Try walking around.  Put heat on your belly (abdomen) as told by your doctor. Use a heat source that your doctor recommends, such as a moist heat pack or a heating pad. ? Put a towel between your skin and the heat source. ? Leave the heat on for 20-30 minutes. ? Remove the heat if your skin turns bright red. This is especially important if you cannot feel pain, heat, or cold. You can get burned. Eating and drinking   Drink enough fluid to keep your pee (urine) clear or pale yellow.  Return to your normal diet as told by your doctor. Avoid heavy or fried foods that are hard to digest.  Avoid drinking alcohol for as long as told by your doctor. Contact a doctor if:  You have blood in your poop (stool) 2-3 days after the procedure. Get help right away if:  You  have more than a small amount of blood in your poop.  You see large clumps of tissue (blood clots) in your poop.  Your belly is swollen.  You feel sick to your stomach (nauseous).  You throw up (vomit).  You have a fever.  You have belly pain that gets worse, and medicine does not help your pain. Summary  After the procedure, it is common to have a small amount of blood in your poop. You may also have mild cramping and bloating in your belly.  For the first 24 hours after the procedure, do not drive or use machinery, do not sign important documents, and do not drink alcohol.  Get help right away if you have a lot of blood in your poop, feel  sick to your stomach, have a fever, or have more belly pain. This information is not intended to replace advice given to you by your health care provider. Make sure you discuss any questions you have with your health care provider. Document Released: 10/13/2010 Document Revised: 07/11/2017 Document Reviewed: 06/04/2016 Elsevier Patient Education  2020 [employer].

## 2019-04-08 NOTE — Progress Notes (Signed)
Subjective:    Patient ID: Johnny Palmer, male    DOB: 24-Jun-1956, 63 y.o.   MRN: [id number]  HPI 63 yo male in non acute distress here for annual physical exam. Does state he is under a lot of stress with Covid-19 situation and the University coming up on fall semester. He has lost 40lbs since his last visit with me on purpose due my encouragement of  Getting in shape. His currentl BMI is  20.8. I spoke with the patient I would not like to see him lose any more weight.   Blood pressure (!) 152/80, pulse 68, temperature (!) 97.4 F (36.3 C), temperature source Tympanic, resp. rate 16, height 5\' 11"  (1.803 m), weight 145 lb (65.8 kg), SpO2 99 %.    Needs Colonoscopy referral  Per patient , seen by Dr. Gustavo Lah.   No Known Allergies  5 11 145 exersices  Daily walking daily  30 min , rides recomberant bike and then stretches.   Social History   Socioeconomic History  . Marital status: Married    Spouse name: Not on file  . Number of children: Not on file  . Years of education: Not on file  . Highest education level: Not on file  Occupational History  . Not on file  Social Needs  . Financial resource strain: Not on file  . Food insecurity    Worry: Not on file    Inability: Not on file  . Transportation needs    Medical: Not on file    Non-medical: Not on file  Tobacco Use  . Smoking status: Never Smoker  . Smokeless tobacco: Never Used  Substance and Sexual Activity  . Alcohol use: Yes    Alcohol/week: 2.0 standard drinks    Types: 2 Cans of beer per week    Comment: social per week  . Drug use: No  . Sexual activity: Yes  Lifestyle  . Physical activity    Days per week: Not on file    Minutes per session: Not on file  . Stress: Not on file  Relationships  . Social Herbalist on phone: Not on file    Gets together: Not on file    Attends religious service: Not on file    Active member of club or organization: Not on file    Attends meetings of clubs  or organizations: Not on file    Relationship status: Not on file  . Intimate partner violence    Fear of current or ex partner: Not on file    Emotionally abused: Not on file    Physically abused: Not on file    Forced sexual activity: Not on file  Other Topics Concern  . Not on file  Social History Narrative  . Not on file    Family History  Problem Relation Age of Onset  . Heart disease Mother   . Hyperlipidemia Father   . Diabetes Father   . Heart attack Paternal Grandfather    No one with high blood pressure. Review of Systems  Constitutional: Positive for activity change (increased exercising since last annual  visit with me). Negative for unexpected weight change (lost weight due to elevated  A1C).  HENT: Negative.   Eyes: Negative.   Respiratory: Negative.   Cardiovascular: Negative.   Gastrointestinal: Negative.   Endocrine: Negative.   Genitourinary: Positive for urgency.  Musculoskeletal: Positive for back pain (L4-L5 bulging disc, tingling in the toes left side. ).  Skin: Negative.        Toe fungus but does not want to seek treatment at this time.  Allergic/Immunologic: Negative.   Neurological: Negative.   Hematological: Negative.   Psychiatric/Behavioral: Positive for sleep disturbance (usually 60-50s on fitbit.  on and wakes up multiple times through out the night. ). The patient is hyperactive.   Less and less tingling in toes and back pain decreased , stretching helps. Dermatology was fine Dr. Nicole Kindred seen within the last month.  nocturia  3d/day  All other review of systems were negative.. Objective:   Physical Exam Vitals signs and nursing note reviewed.  Constitutional:      Appearance: Normal appearance.  HENT:     Mouth/Throat:     Mouth: Mucous membranes are dry.     Pharynx: Oropharynx is clear.  Eyes:     Extraocular Movements: Extraocular movements intact.     Conjunctiva/sclera: Conjunctivae normal.     Pupils: Pupils are equal, round,  and reactive to light.  Neck:     Musculoskeletal: Normal range of motion and neck supple.  Cardiovascular:     Rate and Rhythm: Normal rate and regular rhythm.     Pulses: Normal pulses.     Heart sounds: Normal heart sounds.  Pulmonary:     Effort: Pulmonary effort is normal.     Breath sounds: Normal breath sounds.  Abdominal:     General: Bowel sounds are normal.     Palpations: Abdomen is soft.  Skin:    General: Skin is warm and dry.  Neurological:     General: No focal deficit present.     Mental Status: He is alert and oriented to person, place, and time.     GCS: GCS eye subscore is 4. GCS verbal subscore is 5. GCS motor subscore is 6.     Cranial Nerves: Cranial nerves are intact.     Sensory: Sensation is intact.     Motor: Motor function is intact.     Coordination: Coordination is intact. Romberg sign negative.     Gait: Gait is intact.     Deep Tendon Reflexes:     Reflex Scores:      Brachioradialis reflexes are 2+ on the right side and 2+ on the left side.      Patellar reflexes are 2+ on the right side and 2+ on the left side.      Achilles reflexes are 2+ on the right side and 2+ on the left side. Psychiatric:        Attention and Perception: Attention and perception normal.        Mood and Affect: Mood and affect normal.        Speech: Speech normal.        Behavior: Behavior normal. Behavior is cooperative.        Thought Content: Thought content normal.        Cognition and Memory: Cognition and memory normal.        Judgment: Judgment normal.     Visual acuity patient declined wears glasses for distance driving. Seen by Opthalmologist Beginning of cataracts but no need for treatment 5yrs., sees Dr. Gloriann Loan.  Breast exam GU exam Rectal exam EKG 51  Sinus brady  Recent Results (from the past 2160 hour(s))  VITAMIN D 25 Hydroxy (Vit-D Deficiency, Fractures)     Status: None   Collection Time: 03/24/19  8:20 AM  Result Value Ref Range   Vit D,  25-Hydroxy 53.1  30.0 - 100.0 ng/mL    Comment: Vitamin D deficiency has been defined by the [place] practice guideline as a level of serum 25-OH vitamin D less than 20 ng/mL (1,2). The Endocrine Society went on to further define vitamin D insufficiency as a level between 21 and 29 ng/mL (2). 1. IOM (Institute of Medicine). 2010. Dietary reference    intakes for calcium and D. [place]: The    [employer]. 2. Holick MF, Binkley [place], Bischoff-Ferrari HA, et al.    Evaluation, treatment, and prevention of vitamin D    deficiency: an Endocrine Society clinical practice    guideline. JCEM. 2011 Jul; 96(7):1911-30.   Hgb A1c w/o eAG     Status: None   Collection Time: 03/24/19  8:20 AM  Result Value Ref Range   Hgb A1c MFr Bld 5.5 4.8 - 5.6 %    Comment:          Prediabetes: 5.7 - 6.4          Diabetes: >6.4          Glycemic control for adults with diabetes: <7.0   CMP12+LP+TP+TSH+6AC+PSA+CBC.     Status: Abnormal   Collection Time: 03/24/19  8:20 AM  Result Value Ref Range   Glucose 109 (H) 65 - 99 mg/dL   Uric Acid 5.0 3.7 - 8.6 mg/dL    Comment:            Therapeutic target for gout patients: <6.0   BUN 17 8 - 27 mg/dL   Creatinine, Ser 1.11 0.76 - 1.27 mg/dL   GFR calc non Af Amer 70 >59 mL/min/1.73   GFR calc Af Amer 81 >59 mL/min/1.73   BUN/Creatinine Ratio 15 10 - 24   Sodium 139 134 - 144 mmol/L   Potassium 4.4 3.5 - 5.2 mmol/L   Chloride 106 96 - 106 mmol/L   Calcium 9.7 8.6 - 10.2 mg/dL   Phosphorus 3.8 2.8 - 4.1 mg/dL   Total Protein 6.5 6.0 - 8.5 g/dL   Albumin 4.3 3.8 - 4.8 g/dL   Globulin, Total 2.2 1.5 - 4.5 g/dL   Albumin/Globulin Ratio 2.0 1.2 - 2.2   Bilirubin Total 1.6 (H) 0.0 - 1.2 mg/dL   Alkaline Phosphatase 68 39 - 117 IU/L   LDH 154 121 - 224 IU/L   AST 19 0 - 40 IU/L   ALT 16 0 - 44 IU/L   GGT 10 0 - 65 IU/L   Iron 75 38 - 169 ug/dL   Cholesterol, Total 141 100 - 199 mg/dL   Triglycerides  44 0 - 149 mg/dL   HDL 60 >39 mg/dL   VLDL Cholesterol Cal 9 5 - 40 mg/dL   LDL Calculated 72 0 - 99 mg/dL   Chol/HDL Ratio 2.4 0.0 - 5.0 ratio    Comment:                                   T. Chol/HDL Ratio                                             Men  Women  1/2 Avg.Risk  3.4    3.3                                   Avg.Risk  5.0    4.4                                2X Avg.Risk  9.6    7.1                                3X Avg.Risk 23.4   11.0    Estimated CHD Risk  < 0.5 0.0 - 1.0 times avg.    Comment:                                   T. Chol/HDL Ratio                                             Men  Women                               1/2 Avg.Risk  3.4    3.3                                   Avg.Risk  5.0    4.4                                2X Avg.Risk  9.6    7.1                                3X Avg.Risk 23.4   11.0 The CHD Risk is based on the T. Chol/HDL ratio.  Other factors affect CHD Risk such as hypertension, smoking, diabetes, severe obesity, and family history of pre- mature CHD.    TSH 1.440 0.450 - 4.500 uIU/mL   T4, Total 6.9 4.5 - 12.0 ug/dL   T3 Uptake Ratio 28 24 - 39 %   Free Thyroxine Index 1.9 1.2 - 4.9   Prostate Specific Ag, Serum 7.8 (H) 0.0 - 4.0 ng/mL    Comment: Roche ECLIA methodology. According to the American Urological Association, Serum PSA should decrease and remain at undetectable levels after radical prostatectomy. The AUA defines biochemical recurrence as an initial PSA value 0.2 ng/mL or greater followed by a subsequent confirmatory PSA value 0.2 ng/mL or greater. Values obtained with different assay methods or kits cannot be used interchangeably. Results cannot be interpreted as absolute evidence of the presence or absence of malignant disease.    WBC 6.4 3.4 - 10.8 x10E3/uL   RBC 4.85 4.14 - 5.80 x10E6/uL   Hemoglobin 15.6 13.0 - 17.7 g/dL   Hematocrit 46.2 37.5 - 51.0 %   MCV 95 79 - 97 fL    MCH 32.2 26.6 - 33.0 pg   MCHC 33.8 31.5 - 35.7 g/dL   RDW 12.4  11.6 - 15.4 %   Platelets 269 150 - 450 x10E3/uL   Neutrophils 60 Not Estab. %   Lymphs 26 Not Estab. %   Monocytes 10 Not Estab. %   Eos 3 Not Estab. %   Basos 1 Not Estab. %   Neutrophils Absolute 3.8 1.4 - 7.0 x10E3/uL   Lymphocytes Absolute 1.6 0.7 - 3.1 x10E3/uL   Monocytes Absolute 0.7 0.1 - 0.9 x10E3/uL   EOS (ABSOLUTE) 0.2 0.0 - 0.4 x10E3/uL   Basophils Absolute 0.1 0.0 - 0.2 x10E3/uL   Immature Granulocytes 0 Not Estab. %   Immature Grans (Abs) 0.0 0.0 - 0.1 x10E3/uL  POCT urinalysis dipstick     Status: Abnormal   Collection Time: 03/24/19  8:39 AM  Result Value Ref Range   Color, UA     Clarity, UA     Glucose, UA Negative Negative   Bilirubin, UA negative    Ketones, UA negative    Spec Grav, UA 1.010 1.010 - 1.025   Blood, UA negative    pH, UA 6.5 5.0 - 8.0   Protein, UA Positive (A) Negative   Urobilinogen, UA 0.2 0.2 or 1.0 E.U./dL   Nitrite, UA negative    Leukocytes, UA Negative Negative   Appearance     Odor    Urinalysis, Routine w reflex microscopic     Status: None   Collection Time: 03/24/19  8:49 AM  Result Value Ref Range   Specific Gravity, UA 1.019 1.005 - 1.030   pH, UA 6.0 5.0 - 7.5   Color, UA Yellow Yellow   Appearance Ur Clear Clear   Leukocytes,UA Negative Negative   Protein,UA Negative Negative/Trace   Glucose, UA Negative Negative   Ketones, UA Negative Negative   RBC, UA Negative Negative   Bilirubin, UA Negative Negative   Urobilinogen, Ur 0.2 0.2 - 1.0 mg/dL   Nitrite, UA Negative Negative   Microscopic Examination Comment     Comment: Microscopic not indicated and not performed.       Assessment & Plan:  Annual physical exam and review of labs Patient referred to Urology appt. 05/20/2019 Hollice Espy MD.  due to elevated PSA 7.8 and nocturia. Elevated blood pressure , asymptomatic. Declines sleep study at this time .  Tdap updated.To take Tylenol per  package directions for pain. Swelling Redness or worsening pain to return to the clinic. VitD 3 1000IU/day to continue. Referral made for Colonoscopy to Dr. Gustavo Lah (his patient previously). Return in one month for blood pressure recheck.  Patient verbalizes understanding and has no questions at discharge.  Return to clinic as needed and in one year for annual exam.

## 2019-04-13 ENCOUNTER — Encounter: Payer: Self-pay | Admitting: Medical

## 2019-04-21 ENCOUNTER — Encounter: Payer: Self-pay | Admitting: Medical

## 2019-05-18 ENCOUNTER — Encounter: Payer: Self-pay | Admitting: Medical

## 2019-05-18 ENCOUNTER — Other Ambulatory Visit: Payer: Self-pay

## 2019-05-18 ENCOUNTER — Ambulatory Visit: Payer: Self-pay | Admitting: Medical

## 2019-05-18 VITALS — BP 145/74 | HR 50 | Temp 96.7°F | Resp 16 | Wt 149.2 lb

## 2019-05-18 DIAGNOSIS — Z0130 Encounter for examination of blood pressure without abnormal findings: Secondary | ICD-10-CM

## 2019-05-18 DIAGNOSIS — Z0000 Encounter for general adult medical examination without abnormal findings: Secondary | ICD-10-CM

## 2019-05-18 NOTE — Progress Notes (Signed)
   Subjective:    Patient ID: Johnny Palmer, male    DOB: 31-May-1956, 63 y.o.   MRN: [id number]  HPI  Blood pressure recheck. Last reading during annual physical was 152/80. No complaints today. He has been checking it at home and it has been running  117/82.  He brought his cuff with him to make sure it was reading the same as our BP cuffs.    Blood pressure (!) 145/74, pulse (!) 50, temperature (!) 96.7 F (35.9 C), temperature source Tympanic, resp. rate 16, weight 149 lb 3.2 oz (67.7 kg), SpO2 99 %. No Known Allergies  Current Outpatient Medications:  .  aspirin 81 MG chewable tablet, Chew by mouth 2 (two) times a week. Sunday and Wednesday, Disp: , Rfl:  .  calcium carbonate (OSCAL) 1500 (600 Ca) MG TABS tablet, Take by mouth 2 (two) times daily with a meal., Disp: , Rfl:     Review of Systems  All other systems reviewed and are negative.  Wednesday Urology appt with .Hollice Espy MD for the elevated  PSA Pending colonoscopy  With [facility] , waiting on phone call. Objective:   Physical Exam Vitals signs and nursing note reviewed.  Constitutional:      Appearance: Normal appearance. He is normal weight.  HENT:     Head: Normocephalic and atraumatic.  Eyes:     Extraocular Movements: Extraocular movements intact.     Conjunctiva/sclera: Conjunctivae normal.     Pupils: Pupils are equal, round, and reactive to light.  Neck:     Musculoskeletal: Normal range of motion and neck supple.  Musculoskeletal: Normal range of motion.  Neurological:     General: No focal deficit present.     Mental Status: He is alert and oriented to person, place, and time.  Psychiatric:        Mood and Affect: Mood normal.        Behavior: Behavior normal.        Thought Content: Thought content normal.        Judgment: Judgment normal.           Assessment & Plan:  Elevated Bp, Most likely white coat syndrome or related to him reading his emails ( which he did just prior to  coming to the clinic), his readings at home were wnl. Will order Prevnar 13 pneumonia vaccine. For health care maintenance. Patient verbalizes understanding and has no question at discharge.

## 2019-05-19 ENCOUNTER — Other Ambulatory Visit: Payer: Self-pay | Admitting: *Deleted

## 2019-05-19 DIAGNOSIS — R972 Elevated prostate specific antigen [PSA]: Secondary | ICD-10-CM

## 2019-05-20 ENCOUNTER — Encounter: Payer: Self-pay | Admitting: Urology

## 2019-05-20 ENCOUNTER — Other Ambulatory Visit: Payer: Self-pay

## 2019-05-20 ENCOUNTER — Ambulatory Visit: Payer: BC Managed Care – PPO | Admitting: Urology

## 2019-05-20 VITALS — BP 162/85 | HR 75 | Ht 71.0 in | Wt 146.0 lb

## 2019-05-20 DIAGNOSIS — R972 Elevated prostate specific antigen [PSA]: Secondary | ICD-10-CM

## 2019-05-20 LAB — BLADDER SCAN AMB NON-IMAGING

## 2019-05-20 NOTE — Progress Notes (Signed)
05/20/2019 8:40 AM   Johnny Palmer 11-20-1955 [id number]  Referring provider: Talmage Nap, PA-C [address] [place],  [place] [address]  Chief Complaint  Patient presents with  . Elevated PSA    New Patient    HPI: 63 year old male referred for further evaluation of elevated PSA.  He underwent routine annual labs by his primary care physician on 03/24/2019 which time his PSA was noted to be elevated to 7.8.  Previous values in 2018 2.3.  His PSA has been as high as 4.3 dating back to 2016 in care everywhere.  Urinalysis from 03/24/2019 was negative.   He denies any significant urinary symptoms.  Occasionally gets up once a night to void but this is not bothersome to him.  IPSS and PVR as below.  He has had an intentional 30 pound weight loss over the last several years.  He rides a recumbent bike daily and eats right.  He works at [employer] is ready to retire.  He does note today that he is had significant stress over the past several months secondary to [place].  No family history of prostate cancer.  IPSS    Row Name 05/20/19 0800         International Prostate Symptom Score   How often have you had the sensation of not emptying your bladder?  Less than 1 in 5     How often have you had to urinate less than every two hours?  Less than 1 in 5 times     How often have you found you stopped and started again several times when you urinated?  Not at All     How often have you found it difficult to postpone urination?  Less than 1 in 5 times     How often have you had a weak urinary stream?  Less than 1 in 5 times     How often have you had to strain to start urination?  Not at All     How many times did you typically get up at night to urinate?  1 Time     Total IPSS Score  5       Quality of Life due to urinary symptoms   If you were to spend the rest of your life with your urinary condition just the way it is now how would you feel about that?  Delighted         Score:  1-7 Mild 8-19 Moderate 20-35 Severe    PMH: Past Medical History:  Diagnosis Date  . Osteoporosis   . PVC (premature ventricular contraction)    history of PVC transcribed from [facility] Record    Surgical History: Past Surgical History:  Procedure Laterality Date  . TONSILLECTOMY  1970    Home Medications:  Allergies as of 05/20/2019   No Known Allergies     Medication List       Accurate as of May 20, 2019  8:40 AM. If you have any questions, ask your nurse or doctor.        aspirin 81 MG chewable tablet Chew by mouth 2 (two) times a week. Sunday and Wednesday   calcium carbonate 1500 (600 Ca) MG Tabs tablet Commonly known as: OSCAL Take by mouth 2 (two) times daily with a meal.   cholecalciferol 25 MCG (1000 UT) tablet Commonly known as: VITAMIN D3 Take 1,000 Units by mouth daily.   FOLIC ACID PO Take by mouth.  Allergies: No Known Allergies  Family History: Family History  Problem Relation Age of Onset  . Heart disease Mother   . Hyperlipidemia Father   . Diabetes Father   . Heart attack Paternal Grandfather     Social History:  reports that he has never smoked. He has never used smokeless tobacco. He reports current alcohol use of about 2.0 standard drinks of alcohol per week. He reports that he does not use drugs.  ROS: UROLOGY Frequent Urination?: No Hard to postpone urination?: No Burning/pain with urination?: No Get up at night to urinate?: Yes Leakage of urine?: No Urine stream starts and stops?: No Trouble starting stream?: No Do you have to strain to urinate?: No Blood in urine?: No Urinary tract infection?: No Sexually transmitted disease?: No Injury to kidneys or bladder?: No Painful intercourse?: No Weak stream?: No Erection problems?: No Penile pain?: No  Gastrointestinal Nausea?: No Vomiting?: No Indigestion/heartburn?: No Diarrhea?: No Constipation?: No  Constitutional Fever: No Night  sweats?: No Weight loss?: No Fatigue?: No  Skin Skin rash/lesions?: No Itching?: No  Eyes Blurred vision?: No Double vision?: No  Ears/Nose/Throat Sore throat?: No Sinus problems?: No  Hematologic/Lymphatic Swollen glands?: No Easy bruising?: No  Cardiovascular Leg swelling?: No Chest pain?: No  Respiratory Cough?: No Shortness of breath?: No  Endocrine Excessive thirst?: No  Musculoskeletal Back pain?: No Joint pain?: No  Neurological Headaches?: No Dizziness?: No  Psychologic Depression?: No Anxiety?: No  Physical Exam: BP (!) 162/85   Pulse 75   Ht 5\' 11"  (1.803 m)   Wt 146 lb (66.2 kg)   BMI 20.36 kg/m   Constitutional:  Alert and oriented, No acute distress. HEENT: [place] AT, moist mucus membranes.  Trachea midline, no masses. Cardiovascular: No clubbing, cyanosis, or edema. Respiratory: Normal respiratory effort, no increased work of breathing. GI: Abdomen is soft, nontender, nondistended, no abdominal masses GU: No CVA tenderness Rectal exam: Normal sphincter tone.  50+ cc rubbery prostate, nontender no nodules. Skin: No rashes, bruises or suspicious lesions. Neurologic: Grossly intact, no focal deficits, moving all 4 extremities. Psychiatric: Normal mood and affect.  Laboratory Data: Lab Results  Component Value Date   WBC 6.4 03/24/2019   HGB 15.6 03/24/2019   HCT 46.2 03/24/2019   MCV 95 03/24/2019   PLT 269 03/24/2019    Lab Results  Component Value Date   CREATININE 1.11 03/24/2019   PSA as above  Lab Results  Component Value Date   HGBA1C 5.5 03/24/2019    Urinalysis    Component Value Date/Time   APPEARANCEUR Clear 03/24/2019 0849   GLUCOSEU Negative 03/24/2019 0849   BILIRUBINUR Negative 03/24/2019 0849   PROTEINUR Negative 03/24/2019 0849   UROBILINOGEN 0.2 03/24/2019 0839   NITRITE Negative 03/24/2019 0849   LEUKOCYTESUR Negative 03/24/2019 0849    Lab Results  Component Value Date   LABMICR Comment  03/24/2019    Pertinent Imaging: Results for orders placed or performed in visit on 05/20/19  Bladder Scan (Post Void Residual) in office  Result Value Ref Range   Scan Result 0ML     Assessment & Plan:      1. Elevated PSA  - Bladder Scan (Post Void Residual) in office - PSA  63 year old male with a history of fluctuating PSA.  Given that this value seems aberrant, I have recommended that we repeated today.  We reviewed the implications of an elevated PSA and the uncertainty surrounding it. In general, a man's PSA increases with age and is  produced by both normal and cancerous prostate tissue. Differential for elevated PSA is BPH, prostate cancer, infection, recent intercourse/ejaculation, prostate infarction, recent urethroscopic manipulation (foley placement/cystoscopy) and prostatitis. Management of an elevated PSA can include observation or prostate biopsy and wediscussed this in detail. We discussed that indications for prostate biopsy are defined by age and race specific PSA cutoffs as well as a PSA velocity of 0.75/year.  Go ahead and discussed prostate biopsy today as well as prostate MRI.We discussed prostate biopsy in detail including the procedure itself, the risks of blood in the urine, stool, and ejaculate, serious infection, and discomfort. He is willing to proceed with this as discussed.  We will call him tomorrow with his PSA results.  Pending these results, make further recommendations on how to proceed.  He is agreeable this plan.  Hollice Espy, MD  [facility] [address] [place], [place] [address] [phone]

## 2019-05-21 ENCOUNTER — Telehealth: Payer: Self-pay | Admitting: Medical

## 2019-05-21 ENCOUNTER — Telehealth: Payer: Self-pay

## 2019-05-21 ENCOUNTER — Ambulatory Visit: Payer: Self-pay

## 2019-05-21 DIAGNOSIS — Z0000 Encounter for general adult medical examination without abnormal findings: Secondary | ICD-10-CM

## 2019-05-21 LAB — PSA: Prostate Specific Ag, Serum: 2.2 ng/mL (ref 0.0–4.0)

## 2019-05-21 NOTE — Telephone Encounter (Signed)
Seen by Dr. Erlene Quan, recheck of his blood his PSA was now normal, nothing more to do at this point, physical in 1-2 years with me as scheduled.

## 2019-05-21 NOTE — Telephone Encounter (Signed)
-----   Message from Hollice Espy, MD sent at 05/21/2019  7:52 AM EDT ----- Awesome news, your PSA is back down to 2.2.  This is your previous baseline.  I do not think that you need any further intervention or surveillance by me.  He can continue to have annual PSAs by her primary care and refer back as needed.  Hollice Espy, MD

## 2019-05-21 NOTE — Telephone Encounter (Signed)
mychart notification sent 

## 2019-05-21 NOTE — Telephone Encounter (Signed)
Left patient a message that his pneumonia vaccine is avaliable to be given in the clinic.  He is to call us asap and schedule an appointment at his earliest convenience.

## 2019-05-22 ENCOUNTER — Ambulatory Visit: Payer: Self-pay | Admitting: Nurse Practitioner

## 2019-06-17 ENCOUNTER — Ambulatory Visit: Payer: Self-pay

## 2019-06-17 ENCOUNTER — Other Ambulatory Visit: Payer: Self-pay

## 2019-06-17 DIAGNOSIS — Z23 Encounter for immunization: Secondary | ICD-10-CM

## 2020-03-14 ENCOUNTER — Other Ambulatory Visit: Payer: Self-pay

## 2020-03-14 ENCOUNTER — Ambulatory Visit: Payer: BLUE CROSS/BLUE SHIELD | Admitting: Dermatology

## 2020-03-14 DIAGNOSIS — L578 Other skin changes due to chronic exposure to nonionizing radiation: Secondary | ICD-10-CM

## 2020-03-14 DIAGNOSIS — D1801 Hemangioma of skin and subcutaneous tissue: Secondary | ICD-10-CM | POA: Diagnosis not present

## 2020-03-14 DIAGNOSIS — Z1283 Encounter for screening for malignant neoplasm of skin: Secondary | ICD-10-CM

## 2020-03-14 DIAGNOSIS — D229 Melanocytic nevi, unspecified: Secondary | ICD-10-CM | POA: Diagnosis not present

## 2020-03-14 DIAGNOSIS — D225 Melanocytic nevi of trunk: Secondary | ICD-10-CM | POA: Diagnosis not present

## 2020-03-14 DIAGNOSIS — L821 Other seborrheic keratosis: Secondary | ICD-10-CM

## 2020-03-14 DIAGNOSIS — L814 Other melanin hyperpigmentation: Secondary | ICD-10-CM

## 2020-03-14 NOTE — Progress Notes (Signed)
   Follow-Up Visit   Subjective  Johnny Palmer is a 64 y.o. male who presents for the following: Annual Exam (Total body skin exam). No new or changing spots noticed.   The following portions of the chart were reviewed this encounter and updated as appropriate:      Review of Systems:  No other skin or systemic complaints except as noted in HPI or Assessment and Plan.  Objective  Well appearing patient in no apparent distress; mood and affect are within normal limits.  All skin waist up examined.  Objective  Right Abdomen: 9.43mm brown flat papule, present for years, no changes  Objective  Left Shoulder - top:  3.74mm stellate med dark brown macule.   Assessment & Plan   Skin cancer screening performed today.  Actinic Damage - diffuse scaly erythematous macules with underlying dyspigmentation - Recommend daily broad spectrum sunscreen SPF 30+ to sun-exposed areas, reapply every 2 hours as needed.  - Call for new or changing lesions.  Seborrheic Keratoses - Stuck-on, waxy, tan-brown papules and plaques  - Discussed benign etiology and prognosis. - Observe - Call for any changes  Melanocytic Nevi - Tan-brown and/or pink-flesh-colored symmetric macules and papules - Benign appearing on exam today - Observation - Call clinic for new or changing moles - Recommend daily use of broad spectrum spf 30+ sunscreen to sun-exposed areas.   Hemangiomas - Red papules - Discussed benign nature - Observe - Call for any changes  Nevus Right Abdomen  Benign-appearing.  Observation.  Call clinic for new or changing moles.  Recommend daily use of broad spectrum spf 30+ sunscreen to sun-exposed areas.    Lentigines Left Shoulder - top  Benign-appearing.  Observation.  Call clinic for new or changing moles.  Recommend daily use of broad spectrum spf 30+ sunscreen to sun-exposed areas.   Lentigines - Scattered tan macules;  - Discussed due to sun exposure - Benign,  observe - Call for any changes  Return in about 1 year (around 03/14/2021) for UBSE.   IJamesetta Orleans, CMA, am acting as scribe for Brendolyn Patty, MD .  Documentation: I have reviewed the above documentation for accuracy and completeness, and I agree with the above.  Brendolyn Patty MD

## 2020-07-21 ENCOUNTER — Encounter: Payer: Self-pay | Admitting: Medical

## 2020-07-31 ENCOUNTER — Encounter: Payer: Self-pay | Admitting: Medical

## 2020-08-01 ENCOUNTER — Other Ambulatory Visit: Payer: Self-pay | Admitting: Medical

## 2020-08-01 ENCOUNTER — Encounter: Payer: Self-pay | Admitting: Medical

## 2020-08-01 DIAGNOSIS — Z1211 Encounter for screening for malignant neoplasm of colon: Secondary | ICD-10-CM

## 2020-08-01 NOTE — Progress Notes (Signed)
Patient sent reminder in October 2020 for colonoscopy , at that time a 1 week quarantine was required. Which he could not do at that time. He is ready to follow Covid-19 protocols, a referral has been sent to [facility] clinic. Messaged patient.

## 2020-12-08 DIAGNOSIS — Z1211 Encounter for screening for malignant neoplasm of colon: Secondary | ICD-10-CM | POA: Diagnosis not present

## 2020-12-08 DIAGNOSIS — Z01818 Encounter for other preprocedural examination: Secondary | ICD-10-CM | POA: Diagnosis not present

## 2020-12-30 DIAGNOSIS — Z23 Encounter for immunization: Secondary | ICD-10-CM | POA: Diagnosis not present

## 2021-02-27 ENCOUNTER — Ambulatory Visit: Payer: Self-pay | Admitting: Medical

## 2021-02-27 ENCOUNTER — Other Ambulatory Visit: Payer: Self-pay

## 2021-02-27 VITALS — BP 168/82 | HR 81 | Temp 97.8°F | Resp 16 | Ht 71.5 in | Wt 145.8 lb

## 2021-02-27 DIAGNOSIS — J302 Other seasonal allergic rhinitis: Secondary | ICD-10-CM

## 2021-02-27 DIAGNOSIS — R6889 Other general symptoms and signs: Secondary | ICD-10-CM

## 2021-02-27 DIAGNOSIS — H6983 Other specified disorders of Eustachian tube, bilateral: Secondary | ICD-10-CM

## 2021-02-27 DIAGNOSIS — Z0000 Encounter for general adult medical examination without abnormal findings: Secondary | ICD-10-CM

## 2021-02-27 DIAGNOSIS — R030 Elevated blood-pressure reading, without diagnosis of hypertension: Secondary | ICD-10-CM

## 2021-02-27 LAB — POCT URINALYSIS DIPSTICK
Bilirubin, UA: NEGATIVE
Blood, UA: NEGATIVE
Glucose, UA: NEGATIVE
Ketones, UA: NEGATIVE
Leukocytes, UA: NEGATIVE
Nitrite, UA: NEGATIVE
Protein, UA: NEGATIVE
Spec Grav, UA: >=1.03 — AB (ref 1.010–1.025)
Urobilinogen, UA: 0.2 E.U./dL
pH, UA: 6 (ref 5.0–8.0)

## 2021-02-27 NOTE — Progress Notes (Signed)
Subjective:    Patient ID: Johnny Palmer, male    DOB: 1955/12/28, 65 y.o.   MRN: [id number]  HPI 65 yo male in nona cute distress , here for annual physical exam. No labs were completed prior to appointment.  Blood pressure (!) 168/82, pulse 81, temperature 97.8 F (36.6 C), temperature source Oral, resp. rate 16, height 5' 11.5" (1.816 m), weight 145 lb 12.8 oz (66.1 kg), SpO2 98 %.  Vison: 20/50 20/60 20/40  Vaccinated with booster x 1  nocturia   No Known Allergies Past Medical History:  Diagnosis Date   Dysplastic nevus 03/17/2018   spinal mid back - moderate   Osteoporosis    PVC (premature ventricular contraction)    history of PVC transcribed from [facility] Record   Past Medical History:  Diagnosis Date   Dysplastic nevus 03/17/2018   spinal mid back - moderate   Osteoporosis    PVC (premature ventricular contraction)    history of PVC transcribed from [facility] Record   Past Surgical History:  Procedure Laterality Date   TONSILLECTOMY  1970     Family History  Problem Relation Age of Onset   Heart disease Mother    Hyperlipidemia Father    Diabetes Father    Heart attack Paternal Grandfather     Social History   Socioeconomic History   Marital status: Married    Spouse name: Not on file   Number of children: Not on file   Years of education: Not on file   Highest education level: Not on file  Occupational History   Not on file  Tobacco Use   Smoking status: Never Smoker   Smokeless tobacco: Never Used  Vaping Use   Vaping Use: Never used  Substance and Sexual Activity   Alcohol use: Yes    Alcohol/week: 2.0 standard drinks    Types: 2 Cans of beer per week    Comment: social per week   Drug use: No   Sexual activity: Yes  Other Topics Concern   Not on file  Social History Narrative   Not on file   Social Determinants of Health   Financial Resource Strain: Not on file  Food Insecurity: Not on file  Transportation  Needs: Not on file  Physical Activity: Not on file  Stress: Not on file  Social Connections: Not on file  Intimate Partner Violence: Not on file   Review of Systems  Allergic/Immunologic: Positive for environmental allergies (spring).  All other systems reviewed and are negative.     [place]  parents are located in [place] his father is  73, mother is 87.   Eye otcjomg amd scratjus  Objective:   Physical Exam Vitals and nursing note reviewed. Exam conducted with a chaperone present.  Constitutional:      Appearance: Normal appearance.  HENT:     Head: Normocephalic and atraumatic.     Jaw: There is normal jaw occlusion.     Right Ear: Ear canal and external ear normal. A middle ear effusion is present.     Left Ear: Ear canal and external ear normal. A middle ear effusion is present.     Nose: Nose normal.     Mouth/Throat:     Mouth: Mucous membranes are moist.     Pharynx: Oropharynx is clear.  Eyes:     General: Lids are normal. Vision grossly intact. Gaze aligned appropriately.     Extraocular Movements: Extraocular movements intact.  Conjunctiva/sclera: Conjunctivae normal.     Left eye: No exudate.    Pupils: Pupils are equal, round, and reactive to light.  Neck:     Thyroid: No thyroid mass, thyromegaly or thyroid tenderness.     Trachea: Trachea and phonation normal.  Cardiovascular:     Rate and Rhythm: Normal rate and regular rhythm.     Pulses: Normal pulses.          Carotid pulses are 2+ on the right side and 2+ on the left side.      Radial pulses are 2+ on the right side and 2+ on the left side.       Popliteal pulses are 2+ on the right side and 2+ on the left side.       Dorsalis pedis pulses are 2+ on the right side and 2+ on the left side.       Posterior tibial pulses are 2+ on the right side and 2+ on the left side.     Heart sounds: Normal heart sounds.  Pulmonary:     Effort: Pulmonary effort is normal.     Breath sounds: Normal breath  sounds.  Chest:  Breasts:    Right: Normal.     Left: Normal.  Abdominal:     General: Abdomen is flat. Bowel sounds are normal.     Palpations: Abdomen is soft.     Tenderness: There is no right CVA tenderness or left CVA tenderness.     Hernia: There is no hernia in the left inguinal area or right inguinal area.  Genitourinary:    Penis: Normal and circumcised.      Testes: Normal.     Epididymis:     Right: Normal.     Left: Normal.     Prostate: Normal.     Rectum: Normal.  Musculoskeletal:        General: Normal range of motion.     Cervical back: Normal range of motion and neck supple.     Right lower leg: No edema.     Left lower leg: No edema.  Feet:     Right foot:     Skin integrity: Skin integrity normal.     Toenail Condition: Fungal disease present.    Left foot:     Skin integrity: Skin integrity normal.     Toenail Condition: Fungal disease present. Lymphadenopathy:     Head:     Right side of head: No submental, submandibular, tonsillar, preauricular, posterior auricular or occipital adenopathy.     Left side of head: No submental, submandibular, tonsillar, preauricular, posterior auricular or occipital adenopathy.     Cervical: No cervical adenopathy.     Upper Body:     Right upper body: No supraclavicular, axillary or pectoral adenopathy.     Left upper body: No supraclavicular, axillary or pectoral adenopathy.     Lower Body: No right inguinal adenopathy. No left inguinal adenopathy.  Skin:    General: Skin is warm and dry.     Capillary Refill: Capillary refill takes less than 2 seconds.  Neurological:     General: No focal deficit present.     Mental Status: He is alert and oriented to person, place, and time. Mental status is at baseline.     GCS: GCS eye subscore is 4. GCS verbal subscore is 5. GCS motor subscore is 6.     Cranial Nerves: Cranial nerves are intact.     Sensory: Sensation is intact.  Motor: Motor function is intact.      Coordination: Coordination is intact.     Gait: Gait is intact.     Deep Tendon Reflexes:     Reflex Scores:      Brachioradialis reflexes are 2+ on the right side and 2+ on the left side.      Patellar reflexes are 2+ on the right side and 2+ on the left side.      Achilles reflexes are 2+ on the right side and 2+ on the left side. Psychiatric:        Attention and Perception: Attention and perception normal.        Mood and Affect: Mood normal.        Speech: Speech normal.        Behavior: Behavior normal. Behavior is cooperative.        Thought Content: Thought content normal.        Cognition and Memory: Cognition and memory normal.        Judgment: Judgment normal.          Assessment & Plan:  Annual physical exam  Lab test pending will call patient and review once resulted.  Allergy Itchy eyes recommended Olopatadine ( patanoal) per package instructions. Eustanchian tube dysfunction bilateral utilize OTC Flonase per package. Elevated blood pressure, 3 month follow up. He does not want to start a blood pressure medication at this time, I reviewed risk of high blood pressure like heart attack or stroke even death. He is to monitor it at home and return to the clinic in 3 month for a blood pressure check.  History of  Osteoporosis Needs Dexa bone density test. Pending labs. Toenail funglus  Lab Orders     Hgb A1c w/o eAG     CMP12+LP+TP+TSH+6AC+PSA+CBC.     VITAMIN D 25 Hydroxy (Vit-D Deficiency, Fractures)     B12 and Folate Panel     POCT urinalysis dipstick Patient verbalizes understanding and has no questions at discharge. No orders of the defined types were placed in this encounter.

## 2021-02-27 NOTE — Patient Instructions (Addendum)
Hypertension, Adult Hypertension is another name for high blood pressure. High blood pressure forces your heart to work harder to pump blood. This can cause problems over time. There are two numbers in a blood pressure reading. There is a top number (systolic) over a bottom number (diastolic). It is best to have a blood pressure that is below 120/80. Healthy choices can help lower your blood pressure, or you may need medicine to help lower it. What are the causes? The cause of this condition is not known. Some conditions may be related to high blood pressure. What increases the risk?  Smoking.  Having type 2 diabetes mellitus, high cholesterol, or both.  Not getting enough exercise or physical activity.  Being overweight.  Having too much fat, sugar, calories, or salt (sodium) in your diet.  Drinking too much alcohol.  Having long-term (chronic) kidney disease.  Having a family history of high blood pressure.  Age. Risk increases with age.  Race. You may be at higher risk if you are African American.  Gender. Men are at higher risk than women before age 45. After age 65, women are at higher risk than men.  Having obstructive sleep apnea.  Stress. What are the signs or symptoms?  High blood pressure may not cause symptoms. Very high blood pressure (hypertensive crisis) may cause: ? Headache. ? Feelings of worry or nervousness (anxiety). ? Shortness of breath. ? Nosebleed. ? A feeling of being sick to your stomach (nausea). ? Throwing up (vomiting). ? Changes in how you see. ? Very bad chest pain. ? Seizures. How is this treated?  This condition is treated by making healthy lifestyle changes, such as: ? Eating healthy foods. ? Exercising more. ? Drinking less alcohol.  Your health care provider may prescribe medicine if lifestyle changes are not enough to get your blood pressure under control, and if: ? Your top number is above 130. ? Your bottom number is above  80.  Your personal target blood pressure may vary. Follow these instructions at home: Eating and drinking  If told, follow the DASH eating plan. To follow this plan: ? Fill one half of your plate at each meal with fruits and vegetables. ? Fill one fourth of your plate at each meal with whole grains. Whole grains include whole-wheat pasta, brown rice, and whole-grain bread. ? Eat or drink low-fat dairy products, such as skim milk or low-fat yogurt. ? Fill one fourth of your plate at each meal with low-fat (lean) proteins. Low-fat proteins include fish, chicken without skin, eggs, beans, and tofu. ? Avoid fatty meat, cured and processed meat, or chicken with skin. ? Avoid pre-made or processed food.  Eat less than 1,500 mg of salt each day.  Do not drink alcohol if: ? Your doctor tells you not to drink. ? You are pregnant, may be pregnant, or are planning to become pregnant.  If you drink alcohol: ? Limit how much you use to:  0-1 drink a day for women.  0-2 drinks a day for men. ? Be aware of how much alcohol is in your drink. In the [place] one drink equals one 12 oz bottle of beer (355 mL), one 5 oz glass of wine (148 mL), or one 1 oz glass of hard liquor (44 mL).   Lifestyle  Work with your doctor to stay at a healthy weight or to lose weight. Ask your doctor what the best weight is for you.  Get at least 30 minutes of exercise most   days of the week. This may include walking, swimming, or biking.  Get at least 30 minutes of exercise that strengthens your muscles (resistance exercise) at least 3 days a week. This may include lifting weights or doing Pilates.  Do not use any products that contain nicotine or tobacco, such as cigarettes, e-cigarettes, and chewing tobacco. If you need help quitting, ask your doctor.  Check your blood pressure at home as told by your doctor.  Keep all follow-up visits as told by your doctor. This is important.   Medicines  Take over-the-counter  and prescription medicines only as told by your doctor. Follow directions carefully.  Do not skip doses of blood pressure medicine. The medicine does not work as well if you skip doses. Skipping doses also puts you at risk for problems.  Ask your doctor about side effects or reactions to medicines that you should watch for. Contact a doctor if you:  Think you are having a reaction to the medicine you are taking.  Have headaches that keep coming back (recurring).  Feel dizzy.  Have swelling in your ankles.  Have trouble with your vision. Get help right away if you:  Get a very bad headache.  Start to feel mixed up (confused).  Feel weak or numb.  Feel faint.  Have very bad pain in your: ? Chest. ? Belly (abdomen).  Throw up more than once.  Have trouble breathing. Summary  Hypertension is another name for high blood pressure.  High blood pressure forces your heart to work harder to pump blood.  For most people, a normal blood pressure is less than 120/80.  Making healthy choices can help lower blood pressure. If your blood pressure does not get lower with healthy choices, you may need to take medicine. This information is not intended to replace advice given to you by your health care provider. Make sure you discuss any questions you have with your health care provider. Document Revised: 05/21/2018 Document Reviewed: 05/21/2018 Elsevier Patient Education  2021 [place]. Bone Density Test A bone density test uses a type of X-ray to measure the amount of calcium and other minerals in a person's bones. It can measure bone density in the hip and the spine. The test is similar to having a regular X-ray. This test may also be called:  Bone densitometry.  Bone mineral density test.  Dual-energy X-ray absorptiometry (DEXA). You may have this test to:  Diagnose a condition that causes weak or thin bones (osteoporosis).  Screen you for osteoporosis.  Predict your  risk for a broken bone (fracture).  Determine how well your osteoporosis treatment is working. Tell a health care provider about:  Any allergies you have.  All medicines you are taking, including vitamins, herbs, eye drops, creams, and over-the-counter medicines.  Any problems you or family members have had with anesthetic medicines.  Any blood disorders you have.  Any surgeries you have had.  Any medical conditions you have.  Whether you are pregnant or may be pregnant.  Any medical tests you have had within the past 14 days that used contrast material. What are the risks? Generally, this is a safe test. However, it does expose you to a small amount of radiation, which can slightly increase your cancer risk. What happens before the test?  Do not take any calcium supplements within the 24 hours before your test.  You will need to remove all metal jewelry, eyeglasses, removable dental appliances, and any other metal objects on your  body. What happens during the test?  You will lie down on an exam table. There will be an X-ray generator below you and an imaging device above you.  Other devices, such as boxes or braces, may be used to position your body properly for the scan.  The machine will slowly scan your body. You will need to keep very still while the machine does the scan.  The images will show up on a screen in the room. Images will be examined by a specialist after your test is finished. The procedure may vary among health care providers and hospitals.   What can I expect after the test? It is up to you to get the results of your test. Ask your health care provider, or the department that is doing the test, when your results will be ready. Summary  A bone density test is an imaging test that uses a type of X-ray to measure the amount of calcium and other minerals in your bones.  The test may be used to diagnose or screen you for a condition that causes weak or thin  bones (osteoporosis), predict your risk for a broken bone (fracture), or determine how well your osteoporosis treatment is working.  Do not take any calcium supplements within 24 hours before your test.  Ask your health care provider, or the department that is doing the test, when your results will be ready. This information is not intended to replace advice given to you by your health care provider. Make sure you discuss any questions you have with your health care provider. Document Revised: 02/25/2020 Document Reviewed: 02/25/2020 Elsevier Patient Education  [place]. Preventing Toenail Fungus from Recurring   Sanitize your shoes with Mycomist spray or a similar shoe sanitizer spray.  Follow the instructions on the bottle and dry them outside in the sun or with a hairdryer.  We also recommend repeating the sanitization once weekly in shoes you wear most often.   Throw away any shoes you have worn a significant amount without socks-fungus thrives in a warm moist environment and you want to avoid re-infection after your laser procedure   Bleach your socks with regular or color safe bleach   Change your socks regularly to keep your feet clean and dry (especially if you have sweaty feet)-if sweaty feet are a problem, let your doctor know-there is a great lotion that helps with this problem.   Clean your toenail clippers with alcohol before you use them if you do your own toenails and make sure to replace [place] boards and orange sticks regularly   If you get regular pedicures, bring your own instruments or go to a spa that sterilizes their instruments in an autoclave.

## 2021-02-28 LAB — CMP12+LP+TP+TSH+6AC+PSA+CBC…
ALT: 17 IU/L (ref 0–44)
AST: 19 IU/L (ref 0–40)
Albumin/Globulin Ratio: 1.8 (ref 1.2–2.2)
Albumin: 4.5 g/dL (ref 3.8–4.8)
Alkaline Phosphatase: 85 IU/L (ref 44–121)
BUN/Creatinine Ratio: 18 (ref 10–24)
BUN: 18 mg/dL (ref 8–27)
Basophils Absolute: 0.1 10*3/uL (ref 0.0–0.2)
Basos: 1 %
Bilirubin Total: 2.2 mg/dL — ABNORMAL HIGH (ref 0.0–1.2)
Calcium: 9.6 mg/dL (ref 8.6–10.2)
Chloride: 101 mmol/L (ref 96–106)
Chol/HDL Ratio: 2.3 ratio (ref 0.0–5.0)
Cholesterol, Total: 154 mg/dL (ref 100–199)
Creatinine, Ser: 1.02 mg/dL (ref 0.76–1.27)
EOS (ABSOLUTE): 0.2 10*3/uL (ref 0.0–0.4)
Eos: 3 %
Estimated CHD Risk: <0.5 times avg. (ref 0.0–1.0)
Free Thyroxine Index: 2 (ref 1.2–4.9)
GGT: 12 IU/L (ref 0–65)
Globulin, Total: 2.5 g/dL (ref 1.5–4.5)
Glucose: 102 mg/dL — ABNORMAL HIGH (ref 65–99)
HDL: 66 mg/dL (ref >39)
Hematocrit: 48 % (ref 37.5–51.0)
Hemoglobin: 16 g/dL (ref 13.0–17.7)
Immature Grans (Abs): 0 10*3/uL (ref 0.0–0.1)
Immature Granulocytes: 0 %
Iron: 162 ug/dL (ref 38–169)
LDH: 178 IU/L (ref 121–224)
LDL Chol Calc (NIH): 77 mg/dL (ref 0–99)
Lymphocytes Absolute: 1.5 10*3/uL (ref 0.7–3.1)
Lymphs: 29 %
MCH: 31.1 pg (ref 26.6–33.0)
MCHC: 33.3 g/dL (ref 31.5–35.7)
MCV: 93 fL (ref 79–97)
Monocytes Absolute: 0.5 10*3/uL (ref 0.1–0.9)
Monocytes: 10 %
Neutrophils Absolute: 3 10*3/uL (ref 1.4–7.0)
Neutrophils: 57 %
Phosphorus: 3.6 mg/dL (ref 2.8–4.1)
Platelets: 286 10*3/uL (ref 150–450)
Potassium: 4.5 mmol/L (ref 3.5–5.2)
Prostate Specific Ag, Serum: 4.1 ng/mL — ABNORMAL HIGH (ref 0.0–4.0)
RBC: 5.15 x10E6/uL (ref 4.14–5.80)
RDW: 12.3 % (ref 11.6–15.4)
Sodium: 139 mmol/L (ref 134–144)
T3 Uptake Ratio: 28 % (ref 24–39)
T4, Total: 7.1 ug/dL (ref 4.5–12.0)
TSH: 1.36 u[IU]/mL (ref 0.450–4.500)
Total Protein: 7 g/dL (ref 6.0–8.5)
Triglycerides: 54 mg/dL (ref 0–149)
Uric Acid: 4 mg/dL (ref 3.8–8.4)
VLDL Cholesterol Cal: 11 mg/dL (ref 5–40)
WBC: 5.2 10*3/uL (ref 3.4–10.8)
eGFR: 81 mL/min/{1.73_m2} (ref >59)

## 2021-02-28 LAB — HGB A1C W/O EAG: Hgb A1c MFr Bld: 5.8 % — ABNORMAL HIGH (ref 4.8–5.6)

## 2021-02-28 LAB — B12 AND FOLATE PANEL
Folate: >20 ng/mL (ref >3.0)
Vitamin B-12: 326 pg/mL (ref 232–1245)

## 2021-02-28 LAB — VITAMIN D 25 HYDROXY (VIT D DEFICIENCY, FRACTURES): Vit D, 25-Hydroxy: 59.4 ng/mL (ref 30.0–100.0)

## 2021-03-14 ENCOUNTER — Encounter: Payer: Self-pay | Admitting: Dermatology

## 2021-03-15 ENCOUNTER — Encounter: Payer: Self-pay | Admitting: Medical

## 2021-03-21 ENCOUNTER — Ambulatory Visit: Payer: BC Managed Care – PPO | Admitting: Dermatology

## 2021-03-22 ENCOUNTER — Other Ambulatory Visit: Payer: Self-pay | Admitting: Medical

## 2021-03-22 ENCOUNTER — Other Ambulatory Visit: Payer: Self-pay

## 2021-03-22 ENCOUNTER — Other Ambulatory Visit: Payer: BC Managed Care – PPO

## 2021-03-22 ENCOUNTER — Telehealth: Payer: BC Managed Care – PPO | Admitting: Medical

## 2021-03-22 DIAGNOSIS — E559 Vitamin D deficiency, unspecified: Secondary | ICD-10-CM

## 2021-03-22 DIAGNOSIS — R7309 Other abnormal glucose: Secondary | ICD-10-CM

## 2021-03-22 DIAGNOSIS — R17 Unspecified jaundice: Secondary | ICD-10-CM

## 2021-03-22 NOTE — Progress Notes (Signed)
65 yo male in non acute distress consents to telemedicne appointment.  Review of labs with patient from  6/6/ 2022.   Glucose  102 trending downward A1C 5.8, increased from  5.6 in 2020 will continue to monitor.  Bilirubin 2.2 will continue to monitor.  PSA 4.1 down from 7.8 referred to Urology  2020  at that time, since he exercises daily they thought maybe this was the reason for elevated PSA on recheck 2.2. Physical exam was normal per patient. Will continue to monitor.    Patient is a well educated individual on  his labs and what he needs to do to bring down his A1C. He exercises daily. During the last three months he traveled to [place] and ate pasta and drank wine, so he feels this may be a reason for the increase in his  A1C.    Vit D 59.4 patient takes  Vit D3 2000IU/day will continue to monitor.  Sept 19th recheck on labs. Orders Placed This Encounter  Procedures   Hgb A1c w/o eAG    Standing Status:   Future    Standing Expiration Date:   03/22/2022   CMP12+LP+TP+TSH+6AC+PSA+CBC.    Standing Status:   Future    Standing Expiration Date:   03/22/2022   VITAMIN D 25 Hydroxy (Vit-D Deficiency, Fractures)    Standing Status:   Future    Standing Expiration Date:   03/22/2022

## 2021-05-23 ENCOUNTER — Encounter: Payer: Self-pay | Admitting: Medical

## 2021-06-05 DIAGNOSIS — D125 Benign neoplasm of sigmoid colon: Secondary | ICD-10-CM | POA: Diagnosis not present

## 2021-06-05 DIAGNOSIS — D122 Benign neoplasm of ascending colon: Secondary | ICD-10-CM | POA: Diagnosis not present

## 2021-06-05 DIAGNOSIS — K640 First degree hemorrhoids: Secondary | ICD-10-CM | POA: Diagnosis not present

## 2021-06-05 DIAGNOSIS — Z1211 Encounter for screening for malignant neoplasm of colon: Secondary | ICD-10-CM | POA: Diagnosis not present

## 2021-06-07 ENCOUNTER — Ambulatory Visit: Payer: BC Managed Care – PPO | Admitting: Medical

## 2021-06-13 ENCOUNTER — Other Ambulatory Visit: Payer: Self-pay

## 2021-06-13 ENCOUNTER — Encounter: Payer: Self-pay | Admitting: Medical

## 2021-06-13 ENCOUNTER — Ambulatory Visit: Payer: BC Managed Care – PPO | Admitting: Medical

## 2021-06-13 VITALS — BP 160/82 | HR 47 | Temp 97.8°F | Resp 16

## 2021-06-13 DIAGNOSIS — R001 Bradycardia, unspecified: Secondary | ICD-10-CM

## 2021-06-13 DIAGNOSIS — I10 Essential (primary) hypertension: Secondary | ICD-10-CM

## 2021-06-13 NOTE — Progress Notes (Signed)
   Subjective:    Patient ID: Johnny Palmer, male    DOB: September 01, 1956, 65 y.o.   MRN: [id number]  HPI 65 yo male. Non acute distress. He stretch and then rides the recumbent bicycle dialy.  He feels he has white coat syndrome because at home is bp 140/73.He showed me his recordings of his blood pressure  results and it is up and down. Standing up too quickly he gets dizzy and feels like he might pass out. So now he stands up slowly so he does not get that feeling.  He has  had  the Bivalent Covid-19 vaccine  last week.    Review of Systems  Constitutional:  Negative for chills and fever.  HENT:  Negative for congestion and rhinorrhea.   Respiratory:  Negative for cough and shortness of breath.   Neurological:  Positive for dizziness and syncope.  He can get an erection but keep an erection    2 small polys removed at Colonoscopy. 06/05/21  next colonoscopy in 5 years.  Blood pressure (!) 160/82, pulse (!) 47, temperature 97.8 F (36.6 C), temperature source Tympanic, resp. rate 16, SpO2 100 %.   41-42 heart rate.,has been his lowest. Objective:   Physical Exam Vitals and nursing note reviewed.  Constitutional:      Appearance: Normal appearance. He is normal weight.  HENT:     Head: Normocephalic and atraumatic.  Eyes:     Extraocular Movements: Extraocular movements intact.     Conjunctiva/sclera: Conjunctivae normal.     Pupils: Pupils are equal, round, and reactive to light.  Pulmonary:     Effort: Pulmonary effort is normal.  Skin:    General: Skin is warm and dry.  Neurological:     General: No focal deficit present.     Mental Status: He is alert and oriented to person, place, and time. Mental status is at baseline.  Psychiatric:        Mood and Affect: Mood normal.        Behavior: Behavior normal.        Thought Content: Thought content normal.          Assessment & Plan:  Bradycardia  Hypertension Will refer to cardiology due to low heart rate and  elevated blood pressure. He also is interested treatment for erectile dysfunction I will hold off on this for now till he sees cardiology. Recommended sugar free gatorade  before working out and water. He verbalizes understanding and has no questions at discharge. Return to the the clinic as needed.

## 2021-06-13 NOTE — Patient Instructions (Signed)
Bradycardia, Adult Bradycardia is a slower-than-normal heartbeat. A normal resting heart rate for an adult ranges from 60 to 100 beats per minute. With bradycardia, the restingheart rate is less than 60 beats per minute. Bradycardia can prevent enough oxygen from reaching certain areas of your body when you are active. It can be serious if it keeps enough oxygen from reaching your brain and other parts of your body. Bradycardia is not a problem foreveryone. For some healthy adults, a slow resting heart rate is normal. What are the causes? This condition may be caused by: A problem with the heart, including: A problem with the heart's electrical system, such as a heart block. With a heart block, electrical signals between the chambers of the heart are partially or completely blocked, so they are not able to work as they should. A problem with the heart's natural pacemaker (sinus node). Heart disease. A heart attack. Heart damage. Lyme disease. A heart infection. A heart condition that is present at birth (congenital heart defect). Certain medicines that treat heart conditions. Certain conditions, such as hypothyroidism and obstructive sleep apnea. Problems with the balance of chemicals and other substances, like potassium, in the blood. Trauma. Radiation therapy. What increases the risk? You are more likely to develop this condition if you: Are age 65 or older. Have high blood pressure (hypertension), high cholesterol (hyperlipidemia), or diabetes. Drink heavily, use tobacco or nicotine products, or use drugs. What are the signs or symptoms? Symptoms of this condition include: Light-headedness. Feeling faint or fainting. Fatigue and weakness. Trouble with activity or exercise. Shortness of breath. Chest pain (angina). Drowsiness. Confusion. Dizziness. How is this diagnosed? This condition may be diagnosed based on: Your symptoms. Your medical history. A physical exam. During  the exam, your health care provider will listen to your heartbeat and check your pulse. To confirm the diagnosis, your health care provider may order tests, such as: Blood tests. An electrocardiogram (ECG). This test records the heart's electrical activity. The test can show how fast your heart is beating and whether the heartbeat is steady. A test in which you wear a portable device (event recorder or Holter monitor) to record your heart's electrical activity while you go about your day. An exercise test. How is this treated? Treatment for this condition depends on the cause of the condition and how severe your symptoms are. Treatment may involve: Treatment of the underlying condition. Changing your medicines or how much medicine you take. Having a small, battery-operated device called a pacemaker implanted under the skin. When bradycardia occurs, this device can be used to increase your heart rate and help your heart beat in a regular rhythm. Follow these instructions at home: Lifestyle  Manage any health conditions that contribute to bradycardia as told by your health care provider. Follow a heart-healthy diet. A nutrition specialist (dietitian) can help educate you about healthy food options and changes. Follow an exercise program that is approved by your health care provider. Maintain a healthy weight. Try to reduce or manage your stress, such as with yoga or meditation. If you need help reducing stress, ask your health care provider. Do not use any products that contain nicotine or tobacco, such as cigarettes, e-cigarettes, and chewing tobacco. If you need help quitting, ask your health care provider. Do not use illegal drugs. Limit alcohol intake to no more than 1 drink a day for nonpregnant women and 2 drinks a day for men. Be aware of how much alcohol is in your drink. In   the [place] one drink equals one 12 oz bottle of beer (355 mL), one 5 oz glass of wine (148 mL), or one 1 oz glass of  hard liquor (44 mL).  General instructions Take over-the-counter and prescription medicines only as told by your health care provider. Keep all follow-up visits as told by your health care provider. This is important. How is this prevented? In some cases, bradycardia may be prevented by: Treating underlying medical problems. Stopping behaviors or medicines that can trigger the condition. Contact a health care provider if you: Feel light-headed or dizzy. Almost faint. Feel weak or are easily fatigued during physical activity. Experience confusion or have memory problems. Get help right away if: You faint. You have: An irregular heartbeat (palpitations). Chest pain. Trouble breathing. Summary Bradycardia is a slower-than-normal heartbeat. With bradycardia, the resting heart rate is less than 60 beats per minute. Treatment for this condition depends on the cause. Manage any health conditions that contribute to bradycardia as told by your health care provider. Do not use any products that contain nicotine or tobacco, such as cigarettes, e-cigarettes, and chewing tobacco, and limit alcohol intake. Keep all follow-up visits as told by your health care provider. This is important. This information is not intended to replace advice given to you by your health care provider. Make sure you discuss any questions you have with your healthcare provider. Document Revised: 03/24/2018 Document Reviewed: 02/19/2018 Elsevier Patient Education  2022 [employer] Inc[employer]  

## 2021-07-07 ENCOUNTER — Other Ambulatory Visit: Payer: Self-pay

## 2021-07-07 ENCOUNTER — Ambulatory Visit: Payer: BC Managed Care – PPO | Admitting: Cardiology

## 2021-07-07 ENCOUNTER — Encounter: Payer: Self-pay | Admitting: Cardiology

## 2021-07-07 VITALS — BP 180/90 | HR 52 | Ht 71.0 in | Wt 148.0 lb

## 2021-07-07 DIAGNOSIS — I10 Essential (primary) hypertension: Secondary | ICD-10-CM | POA: Diagnosis not present

## 2021-07-07 DIAGNOSIS — R001 Bradycardia, unspecified: Secondary | ICD-10-CM | POA: Diagnosis not present

## 2021-07-07 MED ORDER — LOSARTAN POTASSIUM 25 MG PO TABS: 25.0000 mg | ORAL_TABLET | Freq: Every day | ORAL | 3 refills | Status: DC

## 2021-07-07 NOTE — Patient Instructions (Signed)
Medication Instructions:   Your physician has recommended you make the following change in your medication:    START taking Losartan 25 MG once a day.  *If you need a refill on your cardiac medications before your next appointment, please call your pharmacy*   Lab Work: None ordered If you have labs (blood work) drawn today and your tests are completely normal, you will receive your results only by: [place] (if you have MyChart) OR A paper copy in the mail If you have any lab test that is abnormal or we need to change your treatment, we will call you to review the results.   Testing/Procedures: None ordered   Follow-Up: At [facility], you and your health needs are our priority.  As part of our continuing mission to provide you with exceptional heart care, we have created designated Provider Care Teams.  These Care Teams include your primary Cardiologist (physician) and Advanced Practice Providers (APPs -  Physician Assistants and Nurse Practitioners) who all work together to provide you with the care you need, when you need it.  We recommend signing up for the patient portal called "MyChart".  Sign up information is provided on this After Visit Summary.  MyChart is used to connect with patients for Virtual Visits (Telemedicine).  Patients are able to view lab/test results, encounter notes, upcoming appointments, etc.  Non-urgent messages can be sent to your provider as well.   To learn more about what you can do with MyChart, go to [email or web address].    Your next appointment:   6-8 weeks   The format for your next appointment:   In Person  Provider:   You may see Dr. Garen Lah or one of the following Advanced Practice Providers on your designated Care Team:   Murray Hodgkins, NP Christell Faith, PA-C Marrianne Mood, PA-C Cadence Kathlen Mody, [place]   Other Instructions

## 2021-07-07 NOTE — Progress Notes (Signed)
Cardiology Office Note:    Date:  07/07/2021   ID:  Johnny Palmer, DOB 02-Nov-1955, MRN [id number]  PCP:  Talmage Nap, PA-C   [facility] Providers Cardiologist:  None     Referring MD: Marcy Salvo*   Chief Complaint  Patient presents with   New Patient (Initial Visit)    Referred by PCP for HTN and Loletha Grayer. Meds reviewed verbally with patient.    Johnny Palmer General is a 65 y.o. male who is being seen today for the evaluation of hypertension at the request of Talmage Nap, P*.   History of Present Illness:    Johnny Palmer is a 65 y.o. male with a hx of PVCs who presents due to elevated blood pressures and slow heartbeat.  Patient has noticed blood pressure elevations over the past several weeks.  Checks his blood pressure frequently at home with systolics ranging from 283M to 160s.  He exercises frequently and states being in great shape.  His baseline heart rate is in the 40s to 50s.  He denies chest pain or shortness of breath at rest or with exertion.  Denies palpitations, dizziness, syncope.  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 Sunday and Wednesday   calcium carbonate (OSCAL) 1500 (600 Ca) MG TABS tablet Take by mouth 2 (two) times daily with a meal.   cholecalciferol (VITAMIN D3) 25 MCG (1000 UT) tablet Take 1,000 Units by mouth daily.   FOLIC ACID PO Take by mouth.   losartan (COZAAR) 25 MG tablet Take 1 tablet (25 mg total) by mouth daily.     Allergies:   Patient has no known allergies.   Social History   Socioeconomic History   Marital status: Married     Spouse name: Not on file   Number of children: Not on file   Years of education: Not on file   Highest education level: Not on file  Occupational History   Not on file  Tobacco Use   Smoking status: Never   Smokeless tobacco: Never  Vaping Use   Vaping Use: Never used  Substance and Sexual Activity   Alcohol use: Yes    Alcohol/week: 2.0 standard drinks    Types: 2 Cans of beer per week    Comment: social per week   Drug use: No   Sexual activity: Yes  Other Topics Concern   Not on file  Social History Narrative   Not on file   Social Determinants of Health   Financial Resource Strain: Not on file  Food Insecurity: Not on file  Transportation Needs: Not on file  Physical Activity: Not on file  Stress: Not on file  Social Connections: Not on file     Family History: The patient's family history includes Diabetes in his father; Heart attack in his paternal grandfather; Heart disease in his mother; Hyperlipidemia in his father.  ROS:   Please see the history of present illness.     All other systems reviewed and are negative.  EKGs/Labs/Other Studies Reviewed:    The following  studies were reviewed today:   EKG:  EKG is  ordered today.  The ekg ordered today demonstrates sinus bradycardia, heart rate otherwise normal.  Recent Labs: 02/27/2021: ALT 17; BUN 18; Creatinine, Ser 1.02; Hemoglobin 16.0; Platelets 286; Potassium 4.5; Sodium 139; TSH 1.360  Recent Lipid Panel    Component Value Date/Time   CHOL 154 02/27/2021 0915   TRIG 54 02/27/2021 0915   HDL 66 02/27/2021 0915   CHOLHDL 2.3 02/27/2021 0915   LDLCALC 77 02/27/2021 0915     Risk Assessment/Calculations:          Physical Exam:    VS:  BP (!) 180/90 (BP Location: Left Arm, Patient Position: Sitting, Cuff Size: Normal)   Pulse (!) 52   Ht 5\' 11"  (1.803 m)   Wt 148 lb (67.1 kg)   SpO2 99%   BMI 20.64 kg/m     Wt Readings from Last 3 Encounters:  07/07/21 148 lb (67.1 kg)  02/27/21 145 lb  12.8 oz (66.1 kg)  05/20/19 146 lb (66.2 kg)     GEN:  Well nourished, well developed in no acute distress HEENT: Normal NECK: No JVD; No carotid bruits LYMPHATICS: No lymphadenopathy CARDIAC: RRR, no murmurs, rubs, gallops RESPIRATORY:  Clear to auscultation without rales, wheezing or rhonchi  ABDOMEN: Soft, non-tender, non-distended MUSCULOSKELETAL:  No edema; No deformity  SKIN: Warm and dry NEUROLOGIC:  Alert and oriented x 3 PSYCHIATRIC:  Normal affect   ASSESSMENT:    1. Primary hypertension   2. Bradycardia    PLAN:    In order of problems listed above:  Hypertension, BP elevated.  Start losartan 25 mg daily.  Low-salt diet advised.  Okay for patient to get therapy for ED if needed as per PCP/urology. Bradycardia, EKG showing sinus bradycardia, patient asymptomatic.  No indication for additional testing or pacemaker.  Follow-up in 6 to 8 weeks months.     Medication Adjustments/Labs and Tests Ordered: Current medicines are reviewed at length with the patient today.  Concerns regarding medicines are outlined above.  Orders Placed This Encounter  Procedures   EKG 12-Lead    Meds ordered this encounter  Medications   losartan (COZAAR) 25 MG tablet    Sig: Take 1 tablet (25 mg total) by mouth daily.    Dispense:  30 tablet    Refill:  3     Patient Instructions  Medication Instructions:   Your physician has recommended you make the following change in your medication:    START taking Losartan 25 MG once a day.  *If you need a refill on your cardiac medications before your next appointment, please call your pharmacy*   Lab Work: None ordered If you have labs (blood work) drawn today and your tests are completely normal, you will receive your results only by: [place] (if you have MyChart) OR A paper copy in the mail If you have any lab test that is abnormal or we need to change your treatment, we will call you to review the  results.   Testing/Procedures: None ordered   Follow-Up: At [facility], you and your health needs are our priority.  As part of our continuing mission to provide you with exceptional heart care, we have created designated Provider Care Teams.  These Care Teams include your primary Cardiologist (physician) and Advanced Practice Providers (APPs -  Physician Assistants and Nurse Practitioners) who all work together to provide you with the care you need, when you need it.  We  recommend signing up for the patient portal called "MyChart".  Sign up information is provided on this After Visit Summary.  MyChart is used to connect with patients for Virtual Visits (Telemedicine).  Patients are able to view lab/test results, encounter notes, upcoming appointments, etc.  Non-urgent messages can be sent to your provider as well.   To learn more about what you can do with MyChart, go to [email or web address].    Your next appointment:   6-8 weeks   The format for your next appointment:   In Person  Provider:   You may see Dr. Garen Lah or one of the following Advanced Practice Providers on your designated Care Team:   Murray Hodgkins, NP Christell Faith, PA-C Marrianne Mood, PA-C Cadence [place], [place]   Other Instructions    Signed, Kate Sable, MD  07/07/2021 12:19 PM    [place]

## 2021-08-28 ENCOUNTER — Other Ambulatory Visit: Payer: Self-pay

## 2021-08-28 ENCOUNTER — Ambulatory Visit: Payer: BC Managed Care – PPO | Admitting: Cardiology

## 2021-08-28 ENCOUNTER — Encounter: Payer: Self-pay | Admitting: Cardiology

## 2021-08-28 VITALS — BP 174/80 | HR 46 | Ht 71.0 in | Wt 149.2 lb

## 2021-08-28 DIAGNOSIS — R001 Bradycardia, unspecified: Secondary | ICD-10-CM | POA: Diagnosis not present

## 2021-08-28 DIAGNOSIS — I10 Essential (primary) hypertension: Secondary | ICD-10-CM

## 2021-08-28 MED ORDER — LOSARTAN POTASSIUM 25 MG PO TABS: 25.0000 mg | ORAL_TABLET | Freq: Every day | ORAL | 3 refills | Status: DC

## 2021-08-28 NOTE — Patient Instructions (Addendum)
Medication Instructions:  Your physician recommends that you continue on your current medications as directed. Please refer to the Current Medication list given to you today.  *If you need a refill on your cardiac medications before your next appointment, please call your pharmacy*   Lab Work: None Ordered If you have labs (blood work) drawn today and your tests are completely normal, you will receive your results only by: [place] (if you have MyChart) OR A paper copy in the mail If you have any lab test that is abnormal or we need to change your treatment, we will call you to review the results.   Testing/Procedures:  A 24 hour blood pressure has been ordered.   Follow-Up: At [facility], you and your health needs are our priority.  As part of our continuing mission to provide you with exceptional heart care, we have created designated Provider Care Teams.  These Care Teams include your primary Cardiologist (physician) and Advanced Practice Providers (APPs -  Physician Assistants and Nurse Practitioners) who all work together to provide you with the care you need, when you need it.  We recommend signing up for the patient portal called "MyChart".  Sign up information is provided on this After Visit Summary.  MyChart is used to connect with patients for Virtual Visits (Telemedicine).  Patients are able to view lab/test results, encounter notes, upcoming appointments, etc.  Non-urgent messages can be sent to your provider as well.   To learn more about what you can do with MyChart, go to [email or web address].    Your next appointment:    8 weeks  The format for your next appointment:   In Person  Provider:   You may see Kate Sable, MD or one of the following Advanced Practice Providers on your designated Care Team:   Murray Hodgkins, NP Christell Faith, PA-C Cadence Kathlen [place], [place]    Other Instructions

## 2021-08-28 NOTE — Progress Notes (Signed)
Cardiology Office Note:    Date:  08/28/2021   ID:  Johnny Palmer, DOB January 02, 1956, MRN [id number]  PCP:  Talmage Nap, PA-C   [facility] Providers Cardiologist:  None     Referring MD: Talmage Nap, P*   No chief complaint on file. Follow up    History of Present Illness:    Johnny Palmer is a 65 y.o. male with a hx of hypertension, PVCs who presents for follow-up.  He was previously seen due to elevated blood pressures and slow heartbeat.  EKG shows sinus bradycardia, started on losartan 25 mg daily due to elevated BPs.  Denies chest pain or shortness of breath at rest or with exertion.  Denies dizziness, syncope.  His blood pressures have improved at home with systolics ranging from 101-751W with an average of 120s to 130s.  Denies fatigue or lethargy.  Tolerating medications as prescribed.   Past Medical History:  Diagnosis Date   Dysplastic nevus 03/17/2018   spinal mid back - moderate   Osteoporosis    PVC (premature ventricular contraction)    history of PVC transcribed from [facility]    Past Surgical History:  Procedure Laterality Date   TONSILLECTOMY  1970    Current Medications: Current Meds  Medication Sig   aspirin 81 MG chewable tablet Chew by mouth 2 (two) times a week. Sunday and Wednesday   calcium carbonate (OSCAL) 1500 (600 Ca) MG TABS tablet Take by mouth 2 (two) times daily with a meal.   cholecalciferol (VITAMIN D3) 25 MCG (1000 UT) tablet Take 1,000 Units by mouth daily.   FOLIC ACID PO Take by mouth.   [DISCONTINUED] losartan (COZAAR) 25 MG tablet Take 1 tablet (25 mg total) by mouth daily.     Allergies:   Patient has no known allergies.   Social History   Socioeconomic History   Marital status: Married    Spouse name: Not on file   Number of children: Not on file   Years of education: Not on file   Highest education level: Not on file  Occupational History   Not on file  Tobacco Use   Smoking  status: Never   Smokeless tobacco: Never  Vaping Use   Vaping Use: Never used  Substance and Sexual Activity   Alcohol use: Yes    Alcohol/week: 2.0 standard drinks    Types: 2 Cans of beer per week    Comment: social per week   Drug use: No   Sexual activity: Yes  Other Topics Concern   Not on file  Social History Narrative   Not on file   Social Determinants of Health   Financial Resource Strain: Not on file  Food Insecurity: Not on file  Transportation Needs: Not on file  Physical Activity: Not on file  Stress: Not on file  Social Connections: Not on file     Family History: The patient's family history includes Diabetes in his father; Heart attack in his paternal grandfather; Heart disease in his mother; Hyperlipidemia in his father.  ROS:   Please see the history of present illness.     All other systems reviewed and are negative.  EKGs/Labs/Other Studies Reviewed:    The following studies were reviewed today:   EKG:  EKG is  ordered today.  The ekg ordered today demonstrates sinus bradycardia, heart rate 46 otherwise normal.  Recent Labs: 02/27/2021: ALT 17; BUN 18; Creatinine, Ser 1.02; Hemoglobin 16.0; Platelets 286; Potassium 4.5;  Sodium 139; TSH 1.360  Recent Lipid Panel    Component Value Date/Time   CHOL 154 02/27/2021 0915   TRIG 54 02/27/2021 0915   HDL 66 02/27/2021 0915   CHOLHDL 2.3 02/27/2021 0915   LDLCALC 77 02/27/2021 0915     Risk Assessment/Calculations:          Physical Exam:    VS:  BP (!) 174/80   Pulse (!) 46   Ht 5\' 11"  (1.803 m)   Wt 149 lb 3.2 oz (67.7 kg)   SpO2 96%   BMI 20.81 kg/m     Wt Readings from Last 3 Encounters:  08/28/21 149 lb 3.2 oz (67.7 kg)  07/07/21 148 lb (67.1 kg)  02/27/21 145 lb 12.8 oz (66.1 kg)     GEN:  Well nourished, well developed in no acute distress HEENT: Normal NECK: No JVD; No carotid bruits CARDIAC: RRR, no murmurs, rubs, gallops RESPIRATORY:  Clear to auscultation without  rales, wheezing or rhonchi  ABDOMEN: Soft, non-tender, non-distended MUSCULOSKELETAL:  No edema; No deformity  SKIN: Warm and dry NEUROLOGIC:  Alert and oriented x 3 PSYCHIATRIC:  Normal affect   ASSESSMENT:    1. Primary hypertension   2. Bradycardia   3. White coat syndrome with diagnosis of hypertension     PLAN:    In order of problems listed above:  Hypertension, elevated, better controlled at home with systolics on average 242P to 130s.  Continue losartan 25 mg daily.  Patient likely has whitecoat syndrome. Sinus bradycardia, clinically asymptomatic.  No indication for additional testing or pacemaker. Elevated BPs in the office, better controlled at home.  We will place a 24-hour BP monitoring.  Adjust antihypertensives based on home blood pressure average results.  Follow-up in 6 weeks.     Medication Adjustments/Labs and Tests Ordered: Current medicines are reviewed at length with the patient today.  Concerns regarding medicines are outlined above.  Orders Placed This Encounter  Procedures   24 hour blood pressure monitor   EKG 12-Lead     Meds ordered this encounter  Medications   losartan (COZAAR) 25 MG tablet    Sig: Take 1 tablet (25 mg total) by mouth daily.    Dispense:  30 tablet    Refill:  3      Patient Instructions  Medication Instructions:  Your physician recommends that you continue on your current medications as directed. Please refer to the Current Medication list given to you today.  *If you need a refill on your cardiac medications before your next appointment, please call your pharmacy*   Lab Work: None Ordered If you have labs (blood work) drawn today and your tests are completely normal, you will receive your results only by: [place] (if you have MyChart) OR A paper copy in the mail If you have any lab test that is abnormal or we need to change your treatment, we will call you to review the  results.   Testing/Procedures:  A 24 hour blood pressure has been ordered.   Follow-Up: At [facility], you and your health needs are our priority.  As part of our continuing mission to provide you with exceptional heart care, we have created designated Provider Care Teams.  These Care Teams include your primary Cardiologist (physician) and Advanced Practice Providers (APPs -  Physician Assistants and Nurse Practitioners) who all work together to provide you with the care you need, when you need it.  We recommend signing up for the patient  portal called "MyChart".  Sign up information is provided on this After Visit Summary.  MyChart is used to connect with patients for Virtual Visits (Telemedicine).  Patients are able to view lab/test results, encounter notes, upcoming appointments, etc.  Non-urgent messages can be sent to your provider as well.   To learn more about what you can do with MyChart, go to [email or web address].    Your next appointment:    8 weeks  The format for your next appointment:   In Person  Provider:   You may see Kate Sable, MD or one of the following Advanced Practice Providers on your designated Care Team:   Murray Hodgkins, NP Christell Faith, PA-C Cadence Kathlen Mody, [place]    Other Instructions     Signed, Kate Sable, MD  08/28/2021 12:42 PM    [place]

## 2021-09-11 ENCOUNTER — Ambulatory Visit: Payer: BC Managed Care – PPO | Admitting: Dermatology

## 2021-09-14 ENCOUNTER — Other Ambulatory Visit: Payer: Self-pay | Admitting: Cardiology

## 2021-09-14 ENCOUNTER — Telehealth: Payer: Self-pay | Admitting: *Deleted

## 2021-09-14 DIAGNOSIS — R030 Elevated blood-pressure reading, without diagnosis of hypertension: Secondary | ICD-10-CM

## 2021-09-14 DIAGNOSIS — I10 Essential (primary) hypertension: Secondary | ICD-10-CM

## 2021-09-14 NOTE — Telephone Encounter (Signed)
Scheduled for 24 hour ambulatory blood pressure monitor on Wednesday, 09/20/21, at 9:00 AM.

## 2021-09-19 ENCOUNTER — Other Ambulatory Visit: Payer: Self-pay

## 2021-09-19 ENCOUNTER — Ambulatory Visit: Payer: BC Managed Care – PPO | Admitting: Dermatology

## 2021-09-19 DIAGNOSIS — L821 Other seborrheic keratosis: Secondary | ICD-10-CM | POA: Diagnosis not present

## 2021-09-19 DIAGNOSIS — D1800 Hemangioma unspecified site: Secondary | ICD-10-CM

## 2021-09-19 DIAGNOSIS — Z1283 Encounter for screening for malignant neoplasm of skin: Secondary | ICD-10-CM

## 2021-09-19 DIAGNOSIS — L814 Other melanin hyperpigmentation: Secondary | ICD-10-CM

## 2021-09-19 DIAGNOSIS — L578 Other skin changes due to chronic exposure to nonionizing radiation: Secondary | ICD-10-CM

## 2021-09-19 DIAGNOSIS — D229 Melanocytic nevi, unspecified: Secondary | ICD-10-CM

## 2021-09-19 DIAGNOSIS — Z86018 Personal history of other benign neoplasm: Secondary | ICD-10-CM | POA: Diagnosis not present

## 2021-09-19 DIAGNOSIS — L853 Xerosis cutis: Secondary | ICD-10-CM

## 2021-09-19 NOTE — Progress Notes (Signed)
° °  Follow-Up Visit   Subjective  Johnny Palmer is a 65 y.o. male who presents for the following: Annual Exam (History of dysplastic nevus - TBSE today) and Other (Spots of right post thigh and left shin).  The patient presents for Total-Body Skin Exam (TBSE) for skin cancer screening and mole check.  The patient has spots, moles and lesions to be evaluated, some may be new or changing and the patient has concerns that these could be cancer.   The following portions of the chart were reviewed this encounter and updated as appropriate:       Review of Systems:  No other skin or systemic complaints except as noted in HPI or Assessment and Plan.  Objective  Well appearing patient in no apparent distress; mood and affect are within normal limits.  A full examination was performed including scalp, head, eyes, ears, nose, lips, neck, chest, axillae, abdomen, back, buttocks, bilateral upper extremities, bilateral lower extremities, hands, feet, fingers, toes, fingernails, and toenails. All findings within normal limits unless otherwise noted below.  Left Lower Leg - Anterior, Right Thigh - Posterior Stuck-on, waxy, tan-brown papules and plaques -- Discussed benign etiology and prognosis.   Legs Xerosis    Assessment & Plan   History of Dysplastic Nevi - No evidence of recurrence today - Recommend regular full body skin exams - Recommend daily broad spectrum sunscreen SPF 30+ to sun-exposed areas, reapply every 2 hours as needed.  - Call if any new or changing lesions are noted between office visits   Lentigines - Scattered tan macules - Due to sun exposure - Benign-appearing, observe - Recommend daily broad spectrum sunscreen SPF 30+ to sun-exposed areas, reapply every 2 hours as needed. - Call for any changes  Seborrheic Keratoses - Stuck-on, waxy, tan-brown papules and/or plaques  - Benign-appearing - Discussed benign etiology and prognosis. - Observe - Call for any  changes  Melanocytic Nevi - Tan-brown and/or pink-flesh-colored symmetric macules and papules - Benign appearing on exam today - Observation - Call clinic for new or changing moles - Recommend daily use of broad spectrum spf 30+ sunscreen to sun-exposed areas.   Hemangiomas - Red papules - Discussed benign nature - Observe - Call for any changes  Actinic Damage - Chronic condition, secondary to cumulative UV/sun exposure - diffuse scaly erythematous macules with underlying dyspigmentation - Recommend daily broad spectrum sunscreen SPF 30+ to sun-exposed areas, reapply every 2 hours as needed.  - Staying in the shade or wearing long sleeves, sun glasses (UVA+UVB protection) and wide brim hats (4-inch brim around the entire circumference of the hat) are also recommended for sun protection.  - Call for new or changing lesions.  Skin cancer screening performed today.  Seborrheic keratosis (2) Right Thigh - Posterior; Left Lower Leg - Anterior  Reassured benign age-related growth.  Recommend observation.  Discussed cryotherapy if spot(s) become irritated or inflamed.   Cetaphil Rough and Bumpy cream sample given  Xerosis cutis Legs  Recommend mild soap and moisturizing cream 1-2 times daily.  Gentle skin care handout provided.     Return in about 1 year (around 09/19/2022) for TBSE.  I, Ashok Cordia, CMA, am acting as scribe for Brendolyn Patty, MD .  Documentation: I have reviewed the above documentation for accuracy and completeness, and I agree with the above.  Brendolyn Patty MD

## 2021-09-19 NOTE — Patient Instructions (Addendum)
Seborrheic Keratosis  What causes seborrheic keratoses? Seborrheic keratoses are harmless, common skin growths that first appear during adult life.  As time goes by, more growths appear.  Some people may develop a large number of them.  Seborrheic keratoses appear on both covered and uncovered body parts.  They are not caused by sunlight.  The tendency to develop seborrheic keratoses can be inherited.  They vary in color from skin-colored to gray, brown, or even black.  They can be either smooth or have a rough, warty surface.   Seborrheic keratoses are superficial and look as if they were stuck on the skin.  Under the microscope this type of keratosis looks like layers upon layers of skin.  That is why at times the top layer may seem to fall off, but the rest of the growth remains and re-grows.    Treatment Seborrheic keratoses do not need to be treated, but can easily be removed in the office.  Seborrheic keratoses often cause symptoms when they rub on clothing or jewelry.  Lesions can be in the way of shaving.  If they become inflamed, they can cause itching, soreness, or burning.  Removal of a seborrheic keratosis can be accomplished by freezing, burning, or surgery. If any spot bleeds, scabs, or grows rapidly, please return to have it checked, as these can be an indication of a skin cancer.      If You Need Anything After Your Visit  If you have any questions or concerns for your doctor, please call our main line at [phone] and press option 4 to reach your doctor's medical assistant. If no one answers, please leave a voicemail as directed and we will return your call as soon as possible. Messages left after 4 pm will be answered the following business day.   You may also send [place] a message via [place]. We typically respond to MyChart messages within 1-2 business days.  For prescription refills, please ask your pharmacy to contact our office. Our fax number is [phone].  If you have  an urgent issue when the clinic is closed that cannot wait until the next business day, you can page your doctor at the number below.    Please note that while we do our best to be available for urgent issues outside of office hours, we are not available 24/7.   If you have an urgent issue and are unable to reach [place], you may choose to seek medical care at your doctor's office, retail clinic, urgent care center, or emergency room.  If you have a medical emergency, please immediately call 911 or go to the emergency department.  Pager Numbers  - Dr. Nehemiah Massed: [phone]  - Dr. Laurence Ferrari: [phone]  - Dr. Nicole Kindred: [phone]  In the event of inclement weather, please call our main line at [phone] for an update on the status of any delays or closures.  Dermatology Medication Tips: Please keep the boxes that topical medications come in in order to help keep track of the instructions about where and how to use these. Pharmacies typically print the medication instructions only on the boxes and not directly on the medication tubes.   If your medication is too expensive, please contact our office at [phone] option 4 or send [place] a message through [place].   We are unable to tell what your co-pay for medications will be in advance as this is different depending on your insurance coverage. However, we may be able to find a substitute medication  at lower cost or fill out paperwork to get insurance to cover a needed medication.   If a prior authorization is required to get your medication covered by your insurance company, please allow [place] 1-2 business days to complete this process.  Drug prices often vary depending on where the prescription is filled and some pharmacies may offer cheaper prices.  The website www.goodrx.com contains coupons for medications through different pharmacies. The prices here do not account for what the cost may be with help from insurance (it may be cheaper with  your insurance), but the website can give you the price if you did not use any insurance.  - You can print the associated coupon and take it with your prescription to the pharmacy.  - You may also stop by our office during regular business hours and pick up a GoodRx coupon card.  - If you need your prescription sent electronically to a different pharmacy, notify our office through [facility] or by phone at [phone] option 4.     Si Usted Necesita Algo Despus de Su Visita  Tambin puede enviarnos un mensaje a travs de Pharmacist, community. Por lo general respondemos a los mensajes de MyChart en el transcurso de 1 a 2 das hbiles.  Para renovar recetas, por favor pida a su farmacia que se ponga en contacto con nuestra oficina. Harland Dingwall de fax es [place] [phone].  Si tiene un asunto urgente cuando la clnica est cerrada y que no puede esperar hasta el siguiente da hbil, puede llamar/localizar a su doctor(a) al nmero que aparece a continuacin.   Por favor, tenga en cuenta que aunque hacemos todo lo posible para estar disponibles para asuntos urgentes fuera del horario de [place], no estamos disponibles las 24 horas del da, los 7 das de la [place].   Si tiene un problema urgente y no puede comunicarse con nosotros, puede optar por buscar atencin mdica  en el consultorio de su doctor(a), en una clnica privada, en un centro de atencin urgente o en una sala de emergencias.  Si tiene Engineering geologist, por favor llame inmediatamente al 911 o vaya a la sala de emergencias.  Nmeros de bper  - Dr. Nehemiah Massed: [phone]  - Dra. Moye: [phone]  - Dra. Nicole Kindred: [phone]  En caso de inclemencias del [place], por favor llame a Johnsie Kindred principal al [phone] para una actualizacin sobre el [place] de cualquier retraso o cierre.  Consejos para la medicacin en dermatologa: Por favor, guarde las cajas en las que vienen los medicamentos de uso tpico para  ayudarle a seguir las instrucciones sobre dnde y cmo usarlos. Las farmacias generalmente imprimen las instrucciones del medicamento slo en las cajas y no directamente en los tubos del [place].   Si su medicamento es muy caro, por favor, pngase en contacto con Zigmund Daniel llamando al [phone] y presione la opcin 4 o envenos un mensaje a travs de Pharmacist, community.   No podemos decirle cul ser su copago por los medicamentos por adelantado ya que esto es diferente dependiendo de la cobertura de su seguro. Sin embargo, es posible que podamos encontrar un medicamento sustituto a Electrical engineer un formulario para que el seguro cubra el medicamento que se considera necesario.   Si se requiere una autorizacin previa para que su compaa de seguros [place] su medicamento, por favor permtanos de 1 a 2 das hbiles para completar este proceso.  Los precios de los medicamentos varan con frecuencia dependiendo del Environmental consultant de dnde  se surte la receta y alguna farmacias pueden ofrecer precios ms baratos.  El sitio web www.goodrx.com tiene cupones para medicamentos de Airline pilot. Los precios aqu no tienen en cuenta lo que podra costar con la ayuda del seguro (puede ser ms barato con su seguro), pero el sitio web puede darle el precio si no utiliz Research scientist (physical sciences).  - Puede imprimir el cupn correspondiente y llevarlo con su receta a la farmacia.  - Tambin puede pasar por nuestra oficina durante el horario de atencin regular y Charity fundraiser una tarjeta de cupones de GoodRx.  - Si necesita que su receta se enve electrnicamente a una farmacia diferente, informe a nuestra oficina a travs de MyChart de [place] o por telfono llamando al [phone] y presione la opcin 4.

## 2021-09-20 ENCOUNTER — Ambulatory Visit (INDEPENDENT_AMBULATORY_CARE_PROVIDER_SITE_OTHER): Payer: BC Managed Care – PPO

## 2021-09-20 DIAGNOSIS — R030 Elevated blood-pressure reading, without diagnosis of hypertension: Secondary | ICD-10-CM

## 2021-09-20 DIAGNOSIS — I10 Essential (primary) hypertension: Secondary | ICD-10-CM

## 2021-09-20 NOTE — Progress Notes (Unsigned)
24 hour ambulatory blood pressure monitor applied to patient using standard adult cuff. 

## 2021-09-26 ENCOUNTER — Telehealth: Payer: Self-pay

## 2021-09-26 NOTE — Telephone Encounter (Signed)
-----   Message from Kate Sable, MD sent at 09/22/2021  6:16 PM EST ----- Ambulatory blood pressure monitor revealed average systolic BP of 044 indicating BP is not well controlled.  Increase losartan to 50 mg daily.  Encouraged low-salt diet, monitor BP at home closely and keep a log.

## 2021-09-26 NOTE — Telephone Encounter (Signed)
Called patient and requested a call back,

## 2021-09-27 MED ORDER — LOSARTAN POTASSIUM 25 MG PO TABS: 50.0000 mg | ORAL_TABLET | Freq: Every day | ORAL | 3 refills | Status: DC

## 2021-09-27 NOTE — Telephone Encounter (Signed)
Patient is returning your call.  

## 2021-09-27 NOTE — Telephone Encounter (Signed)
The patient has been notified of the result and verbalized understanding.  All questions (if any) were answered. Kavin Leech, RN 09/27/2021 9:49 AM

## 2021-10-24 ENCOUNTER — Other Ambulatory Visit: Payer: Self-pay

## 2021-10-24 ENCOUNTER — Ambulatory Visit: Payer: BC Managed Care – PPO | Admitting: Cardiology

## 2021-10-24 ENCOUNTER — Encounter: Payer: Self-pay | Admitting: Cardiology

## 2021-10-24 VITALS — BP 158/80 | HR 48 | Ht 71.0 in | Wt 150.4 lb

## 2021-10-24 DIAGNOSIS — I10 Essential (primary) hypertension: Secondary | ICD-10-CM | POA: Diagnosis not present

## 2021-10-24 DIAGNOSIS — R001 Bradycardia, unspecified: Secondary | ICD-10-CM

## 2021-10-24 MED ORDER — LOSARTAN POTASSIUM 100 MG PO TABS: 100.0000 mg | ORAL_TABLET | Freq: Every day | ORAL | 1 refills | Status: DC

## 2021-10-24 NOTE — Progress Notes (Signed)
Cardiology Office Note:    Date:  10/24/2021   ID:  Neysa Hotter, DOB 01-29-1956, MRN [id number]  PCP:  Talmage Nap, PA-C   [facility] Providers Cardiologist:  None     Referring MD: Marcy Salvo*   Chief Complaint  Patient presents with   Other    8 wk f/u no complaints today. Meds reviewed verbally with pt.   Follow up    History of Present Illness:    Johnny Palmer is a 66 y.o. male with a hx of hypertension, PVCs who presents for follow-up.  He was previously seen due to elevated blood pressures.  24-hour BP monitor was given to evaluate BPs at home.  Blood pressures at home average 470 systolic.  Losartan was previously increased to 50 mg daily.  He is compliant with medications.  Denies any adverse effects.  Blood pressures have been improving steadily from 180s to 170s now 150s-140s.    Past Medical History:  Diagnosis Date   Dysplastic nevus 03/17/2018   spinal mid back - moderate   Osteoporosis    PVC (premature ventricular contraction)    history of PVC transcribed from [facility]    Past Surgical History:  Procedure Laterality Date   TONSILLECTOMY  1970    Current Medications: Current Meds  Medication Sig   aspirin 81 MG chewable tablet Chew by mouth 2 (two) times a week. Sunday and Wednesday   calcium carbonate (OSCAL) 1500 (600 Ca) MG TABS tablet Take by mouth 2 (two) times daily with a meal.   cholecalciferol (VITAMIN D3) 25 MCG (1000 UT) tablet Take 1,000 Units by mouth daily.   [DISCONTINUED] losartan (COZAAR) 25 MG tablet Take 2 tablets (50 mg total) by mouth daily.     Allergies:   Patient has no known allergies.   Social History   Socioeconomic History   Marital status: Married    Spouse name: Not on file   Number of children: Not on file   Years of education: Not on file   Highest education level: Not on file  Occupational History   Not on file  Tobacco Use   Smoking status: Never   Smokeless  tobacco: Never  Vaping Use   Vaping Use: Never used  Substance and Sexual Activity   Alcohol use: Yes    Alcohol/week: 2.0 standard drinks    Types: 2 Cans of beer per week    Comment: social per week   Drug use: No   Sexual activity: Yes  Other Topics Concern   Not on file  Social History Narrative   Not on file   Social Determinants of Health   Financial Resource Strain: Not on file  Food Insecurity: Not on file  Transportation Needs: Not on file  Physical Activity: Not on file  Stress: Not on file  Social Connections: Not on file     Family History: The patient's family history includes Diabetes in his father; Heart attack in his paternal grandfather; Heart disease in his mother; Hyperlipidemia in his father.  ROS:   Please see the history of present illness.     All other systems reviewed and are negative.  EKGs/Labs/Other Studies Reviewed:    The following studies were reviewed today:   EKG:  EKG not ordered today.  Recent Labs: 02/27/2021: ALT 17; BUN 18; Creatinine, Ser 1.02; Hemoglobin 16.0; Platelets 286; Potassium 4.5; Sodium 139; TSH 1.360  Recent Lipid Panel    Component Value Date/Time  CHOL 154 02/27/2021 0915   TRIG 54 02/27/2021 0915   HDL 66 02/27/2021 0915   CHOLHDL 2.3 02/27/2021 0915   LDLCALC 77 02/27/2021 0915     Risk Assessment/Calculations:          Physical Exam:    VS:  BP (!) 158/80 (BP Location: Left Arm, Patient Position: Sitting, Cuff Size: Normal)    Pulse (!) 48    Ht 5\' 11"  (1.803 m)    Wt 150 lb 6 oz (68.2 kg)    SpO2 99%    BMI 20.97 kg/m     Wt Readings from Last 3 Encounters:  10/24/21 150 lb 6 oz (68.2 kg)  08/28/21 149 lb 3.2 oz (67.7 kg)  07/07/21 148 lb (67.1 kg)     GEN:  Well nourished, well developed in no acute distress HEENT: Normal NECK: No JVD; No carotid bruits CARDIAC: RRR, no murmurs, rubs, gallops RESPIRATORY:  Clear to auscultation without rales, wheezing or rhonchi  ABDOMEN: Soft,  non-tender, non-distended MUSCULOSKELETAL:  No edema; No deformity  SKIN: Warm and dry NEUROLOGIC:  Alert and oriented x 3 PSYCHIATRIC:  Normal affect   ASSESSMENT:    1. Primary hypertension   2. Bradycardia     PLAN:    In order of problems listed above:  Hypertension, 24-hour BP monitor average systolic 170Y.  BP improving but still elevated.  Increase losartan to 100mg  daily Sinus bradycardia, clinically asymptomatic.  No indication for additional testing or pacemaker.   Follow-up in 6 weeks.     Medication Adjustments/Labs and Tests Ordered: Current medicines are reviewed at length with the patient today.  Concerns regarding medicines are outlined above.  Orders Placed This Encounter  Procedures   EKG 12-Lead     Meds ordered this encounter  Medications   losartan (COZAAR) 100 MG tablet    Sig: Take 1 tablet (100 mg total) by mouth daily.    Dispense:  90 tablet    Refill:  1      Patient Instructions  Medication Instructions:  Your physician has recommended you make the following change in your medication:   INCREASE Losartan to 100 mg daily. An RX has been sent to your pharmacy.  *If you need a refill on your cardiac medications before your next appointment, please call your pharmacy*   Lab Work: None ordered If you have labs (blood work) drawn today and your tests are completely normal, you will receive your results only by: [place] (if you have MyChart) OR A paper copy in the mail If you have any lab test that is abnormal or we need to change your treatment, we will call you to review the results.   Testing/Procedures: None ordered   Follow-Up: At [facility], you and your health needs are our priority.  As part of our continuing mission to provide you with exceptional heart care, we have created designated Provider Care Teams.  These Care Teams include your primary Cardiologist (physician) and Advanced Practice Providers (APPs -   Physician Assistants and Nurse Practitioners) who all work together to provide you with the care you need, when you need it.  We recommend signing up for the patient portal called "MyChart".  Sign up information is provided on this After Visit Summary.  MyChart is used to connect with patients for Virtual Visits (Telemedicine).  Patients are able to view lab/test results, encounter notes, upcoming appointments, etc.  Non-urgent messages can be sent to your provider as well.  To learn more about what you can do with MyChart, go to [email or web address].    Your next appointment:   6-8  week(s)  The format for your next appointment:   In Person  Provider:   Kate Sable, MD{     Other Instructions N/A    Signed, Kate Sable, MD  10/24/2021 10:28 AM    [place]

## 2021-10-24 NOTE — Patient Instructions (Signed)
Medication Instructions:  Your physician has recommended you make the following change in your medication:   INCREASE Losartan to 100 mg daily. An RX has been sent to your pharmacy.  *If you need a refill on your cardiac medications before your next appointment, please call your pharmacy*   Lab Work: None ordered If you have labs (blood work) drawn today and your tests are completely normal, you will receive your results only by: [place] (if you have MyChart) OR A paper copy in the mail If you have any lab test that is abnormal or we need to change your treatment, we will call you to review the results.   Testing/Procedures: None ordered   Follow-Up: At [facility], you and your health needs are our priority.  As part of our continuing mission to provide you with exceptional heart care, we have created designated Provider Care Teams.  These Care Teams include your primary Cardiologist (physician) and Advanced Practice Providers (APPs -  Physician Assistants and Nurse Practitioners) who all work together to provide you with the care you need, when you need it.  We recommend signing up for the patient portal called "MyChart".  Sign up information is provided on this After Visit Summary.  MyChart is used to connect with patients for Virtual Visits (Telemedicine).  Patients are able to view lab/test results, encounter notes, upcoming appointments, etc.  Non-urgent messages can be sent to your provider as well.   To learn more about what you can do with MyChart, go to [email or web address].    Your next appointment:   6-8  week(s)  The format for your next appointment:   In Person  Provider:   Kate Sable, MD{     Other Instructions N/A

## 2021-12-25 ENCOUNTER — Encounter: Payer: Self-pay | Admitting: Cardiology

## 2021-12-25 ENCOUNTER — Ambulatory Visit: Payer: BC Managed Care – PPO | Admitting: Cardiology

## 2021-12-25 VITALS — BP 160/88 | HR 52 | Ht 71.0 in | Wt 148.0 lb

## 2021-12-25 DIAGNOSIS — I10 Essential (primary) hypertension: Secondary | ICD-10-CM

## 2021-12-25 NOTE — Progress Notes (Signed)
?Cardiology Office Note:   ? ?Date:  12/25/2021  ? ?ID:  [place], DOB December 24, 1955, MRN [id number] ? ?PCP:  Talmage Nap, PA-C ?  ?[facility] Providers ?Cardiologist:  None    ? ?Referring MD: Marcy Salvo*  ? ?Chief Complaint  ?Patient presents with  ? OTher  ?  6-8 week follow up -- Meds reviewed verbally with patient.   ? ?Follow up  ? ? ?History of Present Illness:   ? ?Johnny Palmer is a 66 y.o. male with a hx of hypertension, PVCs who presents for follow-up.   ? ?He is being seen due to elevated blood pressures.  Losartan increased after last visit.  He checks blood pressures at home frequently, systolic ranges in the low 100s to 130s, average 120s.  Feels well, denies chest pain or shortness of breath.  Takes medication as prescribed.  Has no other concerns at this time. ? ? ?Past Medical History:  ?Diagnosis Date  ? Dysplastic nevus 03/17/2018  ? spinal mid back - moderate  ? Osteoporosis   ? PVC (premature ventricular contraction)   ? history of PVC transcribed from [place]  ? ? ?Past Surgical History:  ?Procedure Laterality Date  ? TONSILLECTOMY  1970  ? ? ?Current Medications: ?Current Meds  ?Medication Sig  ? aspirin 81 MG chewable tablet Chew by mouth 2 (two) times a week. Sunday and Wednesday  ? calcium carbonate (OSCAL) 1500 (600 Ca) MG TABS tablet Take by mouth 2 (two) times daily with a meal.  ? cholecalciferol (VITAMIN D3) 25 MCG (1000 UT) tablet Take 1,000 Units by mouth daily.  ? losartan (COZAAR) 100 MG tablet Take 1 tablet (100 mg total) by mouth daily.  ?  ? ?Allergies:   Patient has no known allergies.  ? ?Social History  ? ?Socioeconomic History  ? Marital status: Married  ?  Spouse name: Not on file  ? Number of children: Not on file  ? Years of education: Not on file  ? Highest education level: Not on file  ?Occupational History  ? Not on file  ?Tobacco Use  ? Smoking status: Never  ? Smokeless tobacco: Never  ?Vaping Use  ? Vaping Use: Never used   ?Substance and Sexual Activity  ? Alcohol use: Yes  ?  Alcohol/week: 2.0 standard drinks  ?  Types: 2 Cans of beer per week  ?  Comment: social per week  ? Drug use: No  ? Sexual activity: Yes  ?Other Topics Concern  ? Not on file  ?Social History Narrative  ? Not on file  ? ?Social Determinants of Health  ? ?Financial Resource Strain: Not on file  ?Food Insecurity: Not on file  ?Transportation Needs: Not on file  ?Physical Activity: Not on file  ?Stress: Not on file  ?Social Connections: Not on file  ?  ? ?Family History: ?The patient's family history includes Diabetes in his father; Heart attack in his paternal grandfather; Heart disease in his mother; Hyperlipidemia in his father. ? ?ROS:   ?Please see the history of present illness.    ? All other systems reviewed and are negative. ? ?EKGs/Labs/Other Studies Reviewed:   ? ?The following studies were reviewed today: ? ? ?EKG:  EKG not ordered today. ? ?Recent Labs: ?02/27/2021: ALT 17; BUN 18; Creatinine, Ser 1.02; Hemoglobin 16.0; Platelets 286; Potassium 4.5; Sodium 139; TSH 1.360  ?Recent Lipid Panel ?   ?Component Value Date/Time  ? CHOL 154 02/27/2021  0915  ? TRIG 54 02/27/2021 0915  ? HDL 66 02/27/2021 0915  ? CHOLHDL 2.3 02/27/2021 0915  ? [place] 77 02/27/2021 0915  ? ? ? ?Risk Assessment/Calculations:   ? ? ?    ? ?Physical Exam:   ? ?VS:  BP (!) 160/88 (BP Location: Left Arm, Patient Position: Sitting, Cuff Size: Normal)   Pulse (!) 52   Ht '5\' 11"'$  (1.803 m)   Wt 148 lb (67.1 kg)   SpO2 98%   BMI 20.64 kg/m?    ? ?Wt Readings from Last 3 Encounters:  ?12/25/21 148 lb (67.1 kg)  ?10/24/21 150 lb 6 oz (68.2 kg)  ?08/28/21 149 lb 3.2 oz (67.7 kg)  ?  ? ?GEN:  Well nourished, well developed in no acute distress ?HEENT: Normal ?NECK: No JVD; No carotid bruits ?CARDIAC: RRR, no murmurs, rubs, gallops ?RESPIRATORY:  Clear to auscultation without rales, wheezing or rhonchi  ?ABDOMEN: Soft, non-tender, non-distended ?MUSCULOSKELETAL:  No edema; No deformity   ?SKIN: Warm and dry ?NEUROLOGIC:  Alert and oriented x 3 ?PSYCHIATRIC:  Normal affect  ? ?ASSESSMENT:   ? ?1. Primary hypertension   ?2. White coat syndrome with diagnosis of hypertension   ? ? ? ?PLAN:   ? ?In order of problems listed above: ? ?Hypertension, BP in office elevated, well controlled at home.  Patient has a component of whitecoat syndrome.  Continue losartan 100 mg daily.  Average systolic blood pressure at home low 120s. ? ?Follow-up in 6 months ?  ? ? ?Medication Adjustments/Labs and Tests Ordered: ?Current medicines are reviewed at length with the patient today.  Concerns regarding medicines are outlined above.  ?No orders of the defined types were placed in this encounter. ? ? ? ?No orders of the defined types were placed in this encounter. ? ? ? ? ?Patient Instructions  ?Medication Instructions:  ?Your physician recommends that you continue on your current medications as directed. Please refer to the Current Medication list given to you today. ? ?*If you need a refill on your cardiac medications before your next appointment, please call your pharmacy* ? ? ?Lab Work: ?None ordered ?If you have labs (blood work) drawn today and your tests are completely normal, you will receive your results only by: ?MyChart Message (if you have MyChart) OR ?A paper copy in the mail ?If you have any lab test that is abnormal or we need to change your treatment, we will call you to review the results. ? ? ?Testing/Procedures: ?None ordered ? ? ?Follow-Up: ?At [facility], you and your health needs are our priority.  As part of our continuing mission to provide you with exceptional heart care, we have created designated Provider Care Teams.  These Care Teams include your primary Cardiologist (physician) and Advanced Practice Providers (APPs -  Physician Assistants and Nurse Practitioners) who all work together to provide you with the care you need, when you need it. ? ?We recommend signing up for the patient  portal called "MyChart".  Sign up information is provided on this After Visit Summary.  MyChart is used to connect with patients for Virtual Visits (Telemedicine).  Patients are able to view lab/test results, encounter notes, upcoming appointments, etc.  Non-urgent messages can be sent to your provider as well.   ?To learn more about what you can do with MyChart, go to [email or web address].   ? ?Your next appointment:   ?6 month(s) ? ?The format for your next appointment:   ?In Person ? ?Provider:   ?  You may see Kate Sable, MD or one of the following Advanced Practice Providers on your designated Care Team:   ?Murray Hodgkins, NP ?Christell Faith, PA-C ?Cadence Kathlen Mody, PA-C  ? ? ?Other Instructions ? ?  ? ?Signed, ?Kate Sable, MD  ?12/25/2021 9:27 AM    ?[place] ? ?

## 2021-12-25 NOTE — Patient Instructions (Signed)
Medication Instructions:  ?Your physician recommends that you continue on your current medications as directed. Please refer to the Current Medication list given to you today. ? ?*If you need a refill on your cardiac medications before your next appointment, please call your pharmacy* ? ? ?Lab Work: ?None ordered ?If you have labs (blood work) drawn today and your tests are completely normal, you will receive your results only by: ?MyChart Message (if you have MyChart) OR ?A paper copy in the mail ?If you have any lab test that is abnormal or we need to change your treatment, we will call you to review the results. ? ? ?Testing/Procedures: ?None ordered ? ? ?Follow-Up: ?At [facility], you and your health needs are our priority.  As part of our continuing mission to provide you with exceptional heart care, we have created designated Provider Care Teams.  These Care Teams include your primary Cardiologist (physician) and Advanced Practice Providers (APPs -  Physician Assistants and Nurse Practitioners) who all work together to provide you with the care you need, when you need it. ? ?We recommend signing up for the patient portal called "MyChart".  Sign up information is provided on this After Visit Summary.  MyChart is used to connect with patients for Virtual Visits (Telemedicine).  Patients are able to view lab/test results, encounter notes, upcoming appointments, etc.  Non-urgent messages can be sent to your provider as well.   ?To learn more about what you can do with MyChart, go to [email or web address].   ? ?Your next appointment:   ?6 month(s) ? ?The format for your next appointment:   ?In Person ? ?Provider:   ?You may see Kate Sable, MD or one of the following Advanced Practice Providers on your designated Care Team:   ?Murray Hodgkins, NP ?Christell Faith, PA-C ?Cadence Kathlen Mody, PA-C  ? ? ?Other Instructions ? ? ?

## 2022-02-12 ENCOUNTER — Other Ambulatory Visit: Payer: BC Managed Care – PPO

## 2022-02-12 ENCOUNTER — Encounter: Payer: BC Managed Care – PPO | Admitting: Medical

## 2022-02-12 DIAGNOSIS — R17 Unspecified jaundice: Secondary | ICD-10-CM

## 2022-02-12 DIAGNOSIS — R7309 Other abnormal glucose: Secondary | ICD-10-CM

## 2022-02-12 DIAGNOSIS — E559 Vitamin D deficiency, unspecified: Secondary | ICD-10-CM

## 2022-02-12 NOTE — Progress Notes (Signed)
Lab work

## 2022-02-13 LAB — CMP12+LP+TP+TSH+6AC+PSA+CBC…
ALT: 16 IU/L (ref 0–44)
AST: 17 IU/L (ref 0–40)
Albumin/Globulin Ratio: 2.1 (ref 1.2–2.2)
Albumin: 4.2 g/dL (ref 3.8–4.8)
Alkaline Phosphatase: 74 IU/L (ref 44–121)
BUN/Creatinine Ratio: 22 (ref 10–24)
BUN: 23 mg/dL (ref 8–27)
Basophils Absolute: 0.1 10*3/uL (ref 0.0–0.2)
Basos: 1 %
Bilirubin Total: 1.9 mg/dL — ABNORMAL HIGH (ref 0.0–1.2)
Calcium: 9.3 mg/dL (ref 8.6–10.2)
Chloride: 103 mmol/L (ref 96–106)
Chol/HDL Ratio: 2.4 ratio (ref 0.0–5.0)
Cholesterol, Total: 131 mg/dL (ref 100–199)
Creatinine, Ser: 1.03 mg/dL (ref 0.76–1.27)
EOS (ABSOLUTE): 0.2 10*3/uL (ref 0.0–0.4)
Eos: 4 %
Estimated CHD Risk: <0.5 times avg. (ref 0.0–1.0)
Free Thyroxine Index: 2 (ref 1.2–4.9)
GGT: 10 IU/L (ref 0–65)
Globulin, Total: 2 g/dL (ref 1.5–4.5)
Glucose: 99 mg/dL (ref 70–99)
HDL: 55 mg/dL (ref >39)
Hematocrit: 43.2 % (ref 37.5–51.0)
Hemoglobin: 14.4 g/dL (ref 13.0–17.7)
Immature Grans (Abs): 0 10*3/uL (ref 0.0–0.1)
Immature Granulocytes: 0 %
Iron: 127 ug/dL (ref 38–169)
LDH: 127 IU/L (ref 121–224)
LDL Chol Calc (NIH): 66 mg/dL (ref 0–99)
Lymphocytes Absolute: 1.3 10*3/uL (ref 0.7–3.1)
Lymphs: 30 %
MCH: 31.3 pg (ref 26.6–33.0)
MCHC: 33.3 g/dL (ref 31.5–35.7)
MCV: 94 fL (ref 79–97)
Monocytes Absolute: 0.5 10*3/uL (ref 0.1–0.9)
Monocytes: 11 %
Neutrophils Absolute: 2.3 10*3/uL (ref 1.4–7.0)
Neutrophils: 54 %
Phosphorus: 3.8 mg/dL (ref 2.8–4.1)
Platelets: 281 10*3/uL (ref 150–450)
Potassium: 4.9 mmol/L (ref 3.5–5.2)
Prostate Specific Ag, Serum: 3.3 ng/mL (ref 0.0–4.0)
RBC: 4.6 x10E6/uL (ref 4.14–5.80)
RDW: 12.3 % (ref 11.6–15.4)
Sodium: 138 mmol/L (ref 134–144)
T3 Uptake Ratio: 30 % (ref 24–39)
T4, Total: 6.7 ug/dL (ref 4.5–12.0)
TSH: 1.41 u[IU]/mL (ref 0.450–4.500)
Total Protein: 6.2 g/dL (ref 6.0–8.5)
Triglycerides: 44 mg/dL (ref 0–149)
Uric Acid: 3.9 mg/dL (ref 3.8–8.4)
VLDL Cholesterol Cal: 10 mg/dL (ref 5–40)
WBC: 4.3 10*3/uL (ref 3.4–10.8)
eGFR: 80 mL/min/{1.73_m2} (ref >59)

## 2022-02-13 LAB — VITAMIN D 25 HYDROXY (VIT D DEFICIENCY, FRACTURES): Vit D, 25-Hydroxy: 50 ng/mL (ref 30.0–100.0)

## 2022-02-13 LAB — HGB A1C W/O EAG: Hgb A1c MFr Bld: 5.9 % — ABNORMAL HIGH (ref 4.8–5.6)

## 2022-02-20 ENCOUNTER — Ambulatory Visit: Payer: BC Managed Care – PPO | Admitting: Medical

## 2022-02-20 VITALS — BP 144/82 | HR 51 | Temp 97.0°F | Resp 16 | Wt 148.0 lb

## 2022-02-20 DIAGNOSIS — Z0000 Encounter for general adult medical examination without abnormal findings: Secondary | ICD-10-CM

## 2022-02-20 DIAGNOSIS — I10 Essential (primary) hypertension: Secondary | ICD-10-CM

## 2022-02-20 DIAGNOSIS — R0781 Pleurodynia: Secondary | ICD-10-CM

## 2022-02-20 LAB — POCT URINALYSIS DIPSTICK
Bilirubin, UA: NEGATIVE
Blood, UA: NEGATIVE
Glucose, UA: NEGATIVE
Ketones, UA: NEGATIVE
Leukocytes, UA: NEGATIVE
Nitrite, UA: NEGATIVE
Protein, UA: NEGATIVE
Spec Grav, UA: 1.01 (ref 1.010–1.025)
Urobilinogen, UA: 0.2 E.U./dL
pH, UA: 5 (ref 5.0–8.0)

## 2022-02-20 NOTE — Progress Notes (Signed)
Subjective:    Patient ID: Johnny Palmer, male    DOB: 24-Oct-1955, 66 y.o.   MRN: [id number]  HPI  66 yo male in non acute distress. Coumplaints today was tender on bottom rib left side.( Hurt it while reaching for something.).  Colonoscopey spring  2023  2 polys removed  No Known Allergies  Past Medical History:  Diagnosis Date   Dysplastic nevus 03/17/2018   spinal mid back - moderate   Osteoporosis    PVC (premature ventricular contraction)    history of PVC transcribed from [facility] Record    Past Surgical History:  Procedure Laterality Date   TONSILLECTOMY  1970    Family History  Problem Relation Age of Onset   Heart disease Mother    Hyperlipidemia Father    Diabetes Father    Heart attack Paternal Grandfather     Social History   Socioeconomic History   Marital status: Married    Spouse name: Not on file   Number of children: Not on file   Years of education: Not on file   Highest education level: Not on file  Occupational History   Not on file  Tobacco Use   Smoking status: Never   Smokeless tobacco: Never  Vaping Use   Vaping Use: Never used  Substance and Sexual Activity   Alcohol use: Yes    Alcohol/week: 2.0 standard drinks    Types: 2 Cans of beer per week    Comment: social per week   Drug use: No   Sexual activity: Yes  Other Topics Concern   Not on file  Social History Narrative   Not on file   Social Determinants of Health   Financial Resource Strain: Not on file  Food Insecurity: Not on file  Transportation Needs: Not on file  Physical Activity: Not on file  Stress: Not on file  Social Connections: Not on file  Intimate Partner Violence: Not on file    Review of Systems  Respiratory:         Rib tenderness, last rib level anterior. Skin wnl , mildly tender on exam.  All other systems reviewed and are negative.      Objective:   Physical Exam        Recent Results (from the past 2160 hour(s))  VITAMIN D 25  Hydroxy (Vit-D Deficiency, Fractures)     Status: None   Collection Time: 02/12/22  9:30 AM  Result Value Ref Range   Vit D, 25-Hydroxy 50.0 30.0 - 100.0 ng/mL    Comment: Vitamin D deficiency has been defined by the [place] practice guideline as a level of serum 25-OH vitamin D less than 20 ng/mL (1,2). The Endocrine Society went on to further define vitamin D insufficiency as a level between 21 and 29 ng/mL (2). 1. IOM (Institute of Medicine). 2010. Dietary reference    intakes for calcium and D. [place]: The    [employer]. 2. Holick MF, Binkley Greenwood, Bischoff-Ferrari HA, et al.    Evaluation, treatment, and prevention of vitamin D    deficiency: an Endocrine Society clinical practice    guideline. JCEM. 2011 Jul; 96(7):1911-30.   CMP12+LP+TP+TSH+6AC+PSA+CBC.     Status: Abnormal   Collection Time: 02/12/22  9:30 AM  Result Value Ref Range   Glucose 99 70 - 99 mg/dL   Uric Acid 3.9 3.8 - 8.4 mg/dL    Comment:  Therapeutic target for gout patients: <6.0   BUN 23 8 - 27 mg/dL   Creatinine, Ser 1.03 0.76 - 1.27 mg/dL   eGFR 80 >59 mL/min/1.73   BUN/Creatinine Ratio 22 10 - 24   Sodium 138 134 - 144 mmol/L   Potassium 4.9 3.5 - 5.2 mmol/L   Chloride 103 96 - 106 mmol/L   Calcium 9.3 8.6 - 10.2 mg/dL   Phosphorus 3.8 2.8 - 4.1 mg/dL   Total Protein 6.2 6.0 - 8.5 g/dL   Albumin 4.2 3.8 - 4.8 g/dL   Globulin, Total 2.0 1.5 - 4.5 g/dL   Albumin/Globulin Ratio 2.1 1.2 - 2.2   Bilirubin Total 1.9 (H) 0.0 - 1.2 mg/dL   Alkaline Phosphatase 74 44 - 121 IU/L   LDH 127 121 - 224 IU/L   AST 17 0 - 40 IU/L   ALT 16 0 - 44 IU/L   GGT 10 0 - 65 IU/L   Iron 127 38 - 169 ug/dL   Cholesterol, Total 131 100 - 199 mg/dL   Triglycerides 44 0 - 149 mg/dL   HDL 55 >39 mg/dL   VLDL Cholesterol Cal 10 5 - 40 mg/dL   LDL Chol Calc (NIH) 66 0 - 99 mg/dL   Chol/HDL Ratio 2.4 0.0 - 5.0 ratio    Comment:                                    T. Chol/HDL Ratio                                             Men  Women                               1/2 Avg.Risk  3.4    3.3                                   Avg.Risk  5.0    4.4                                2X Avg.Risk  9.6    7.1                                3X Avg.Risk 23.4   11.0    Estimated CHD Risk  < 0.5 0.0 - 1.0 times avg.    Comment: The CHD Risk is based on the T. Chol/HDL ratio. Other factors affect CHD Risk such as hypertension, smoking, diabetes, severe obesity, and family history of premature CHD.    TSH 1.410 0.450 - 4.500 uIU/mL   T4, Total 6.7 4.5 - 12.0 ug/dL   T3 Uptake Ratio 30 24 - 39 %   Free Thyroxine Index 2.0 1.2 - 4.9   Prostate Specific Ag, Serum 3.3 0.0 - 4.0 ng/mL    Comment: Roche ECLIA methodology. According to the American Urological Association, Serum PSA should decrease and remain at undetectable levels after radical prostatectomy. The AUA defines biochemical recurrence as an initial PSA value 0.2 ng/mL or greater  followed by a subsequent confirmatory PSA value 0.2 ng/mL or greater. Values obtained with different assay methods or kits cannot be used interchangeably. Results cannot be interpreted as absolute evidence of the presence or absence of malignant disease.    WBC 4.3 3.4 - 10.8 x10E3/uL   RBC 4.60 4.14 - 5.80 x10E6/uL   Hemoglobin 14.4 13.0 - 17.7 g/dL   Hematocrit 43.2 37.5 - 51.0 %   MCV 94 79 - 97 fL   MCH 31.3 26.6 - 33.0 pg   MCHC 33.3 31.5 - 35.7 g/dL   RDW 12.3 11.6 - 15.4 %   Platelets 281 150 - 450 x10E3/uL   Neutrophils 54 Not Estab. %   Lymphs 30 Not Estab. %   Monocytes 11 Not Estab. %   Eos 4 Not Estab. %   Basos 1 Not Estab. %   Neutrophils Absolute 2.3 1.4 - 7.0 x10E3/uL   Lymphocytes Absolute 1.3 0.7 - 3.1 x10E3/uL   Monocytes Absolute 0.5 0.1 - 0.9 x10E3/uL   EOS (ABSOLUTE) 0.2 0.0 - 0.4 x10E3/uL   Basophils Absolute 0.1 0.0 - 0.2 x10E3/uL   Immature Granulocytes 0 Not Estab. %   Immature Grans  (Abs) 0.0 0.0 - 0.1 x10E3/uL  Hgb A1c w/o eAG     Status: Abnormal   Collection Time: 02/12/22  9:30 AM  Result Value Ref Range   Hgb A1c MFr Bld 5.9 (H) 4.8 - 5.6 %    Comment:          Prediabetes: 5.7 - 6.4          Diabetes: >6.4          Glycemic control for adults with diabetes: <7.0     Assessment & Plan:   Encounter Diagnoses  Name Primary?   Annual physical exam Yes   Hypertension, unspecified type    Rib pain     Ibuprofen or Tylenol for rib pain, ice to the area. Return in 1-2 weeks if symptoms have not resolved.   No orders of the defined types were placed in this encounter.  Return to clinic as needed.  Patient verbalizes understanding and has no questions at discharge.

## 2022-04-02 ENCOUNTER — Encounter: Payer: Self-pay | Admitting: Medical

## 2022-04-04 ENCOUNTER — Encounter: Payer: Self-pay | Admitting: Medical

## 2022-04-11 ENCOUNTER — Other Ambulatory Visit: Payer: Self-pay | Admitting: Cardiology

## 2022-04-11 MED ORDER — LOSARTAN POTASSIUM 100 MG PO TABS: 100.0000 mg | ORAL_TABLET | Freq: Every day | ORAL | 1 refills | Status: DC

## 2022-05-02 ENCOUNTER — Encounter: Payer: Self-pay | Admitting: Nurse Practitioner

## 2022-06-19 ENCOUNTER — Encounter: Payer: Self-pay | Admitting: Cardiology

## 2022-06-19 ENCOUNTER — Ambulatory Visit: Payer: BC Managed Care – PPO | Attending: Cardiology | Admitting: Cardiology

## 2022-06-19 VITALS — BP 142/64 | HR 54 | Ht 71.0 in | Wt 149.2 lb

## 2022-06-19 DIAGNOSIS — I10 Essential (primary) hypertension: Secondary | ICD-10-CM

## 2022-06-19 NOTE — Progress Notes (Signed)
Cardiology Office Note:    Date:  06/19/2022   ID:  Johnny Palmer, DOB 1956/01/04, MRN [id number]  PCP:  Talmage Nap, PA-C (Inactive)   [facility] Providers Cardiologist:  None     Referring MD: Marcy Salvo*   Chief Complaint  Patient presents with   Follow-up    6 Month Follow up, HTN    Follow up    History of Present Illness:    Johnny Palmer is a 66 y.o. male with a hx of hypertension, PVCs who presents for follow-up.    He is being seen due to elevated blood pressures.  Losartan increased after last visit.  He checks blood pressures at home frequently, systolic ranges in the low 100s to 130s, average 120s.  Feels well, denies chest pain or shortness of breath.  Takes medication as prescribed.  Has no other concerns at this time.   Past Medical History:  Diagnosis Date   Dysplastic nevus 03/17/2018   spinal mid back - moderate   Osteoporosis    PVC (premature ventricular contraction)    history of PVC transcribed from [facility] Record    Past Surgical History:  Procedure Laterality Date   TONSILLECTOMY  1970    Current Medications: Current Meds  Medication Sig   aspirin 81 MG chewable tablet Chew by mouth 2 (two) times a week. Sunday and Wednesday   calcium carbonate (OSCAL) 1500 (600 Ca) MG TABS tablet Take 600 mg of elemental calcium by mouth daily.   cholecalciferol (VITAMIN D3) 25 MCG (1000 UT) tablet Take 1,000 Units by mouth daily.   losartan (COZAAR) 100 MG tablet Take 1 tablet (100 mg total) by mouth daily.     Allergies:   Patient has no known allergies.   Social History   Socioeconomic History   Marital status: Married    Spouse name: Not on file   Number of children: Not on file   Years of education: Not on file   Highest education level: Not on file  Occupational History   Not on file  Tobacco Use   Smoking status: Never   Smokeless tobacco: Never  Vaping Use   Vaping Use: Never used  Substance and  Sexual Activity   Alcohol use: Yes    Alcohol/week: 2.0 standard drinks of alcohol    Types: 2 Cans of beer per week    Comment: social per week   Drug use: No   Sexual activity: Yes  Other Topics Concern   Not on file  Social History Narrative   Not on file   Social Determinants of Health   Financial Resource Strain: Not on file  Food Insecurity: Not on file  Transportation Needs: Not on file  Physical Activity: Not on file  Stress: Not on file  Social Connections: Not on file     Family History: The patient's family history includes Diabetes in his father; Heart attack in his paternal grandfather; Heart disease in his mother; Hyperlipidemia in his father.  ROS:   Please see the history of present illness.     All other systems reviewed and are negative.  EKGs/Labs/Other Studies Reviewed:    The following studies were reviewed today:   EKG:  EKG not ordered today.  Recent Labs: 02/12/2022: ALT 16; BUN 23; Creatinine, Ser 1.03; Hemoglobin 14.4; Platelets 281; Potassium 4.9; Sodium 138; TSH 1.410  Recent Lipid Panel    Component Value Date/Time   CHOL 131 02/12/2022 0930  TRIG 44 02/12/2022 0930   HDL 55 02/12/2022 0930   CHOLHDL 2.4 02/12/2022 0930   LDLCALC 66 02/12/2022 0930     Risk Assessment/Calculations:          Physical Exam:    VS:  BP (!) 142/64 (BP Location: Left Arm, Patient Position: Sitting, Cuff Size: Normal)   Pulse (!) 54   Ht '5\' 11"'$  (1.803 m)   Wt 149 lb 3.2 oz (67.7 kg)   BMI 20.81 kg/m     Wt Readings from Last 3 Encounters:  06/19/22 149 lb 3.2 oz (67.7 kg)  02/20/22 148 lb (67.1 kg)  12/25/21 148 lb (67.1 kg)     GEN:  Well nourished, well developed in no acute distress HEENT: Normal NECK: No JVD; No carotid bruits CARDIAC: RRR, no murmurs, rubs, gallops RESPIRATORY:  Clear to auscultation without rales, wheezing or rhonchi  ABDOMEN: Soft, non-tender, non-distended MUSCULOSKELETAL:  No edema; No deformity  SKIN: Warm  and dry NEUROLOGIC:  Alert and oriented x 3 PSYCHIATRIC:  Normal affect   ASSESSMENT:    1. Primary hypertension   2. White coat syndrome with diagnosis of hypertension    PLAN:    In order of problems listed above:  Hypertension, BP in office elevated, well controlled at home.  Patient has a component of whitecoat syndrome.  Continue losartan 100 mg daily.  Average systolic blood pressure at home low 120s.  Follow-up in 6 months     Medication Adjustments/Labs and Tests Ordered: Current medicines are reviewed at length with the patient today.  Concerns regarding medicines are outlined above.  Orders Placed This Encounter  Procedures   EKG 12-Lead     No orders of the defined types were placed in this encounter.     Patient Instructions  Medication Instructions:  Your physician recommends that you continue on your current medications as directed. Please refer to the Current Medication list given to you today.  *If you need a refill on your cardiac medications before your next appointment, please call your pharmacy*    Follow-Up: At [facility], you and your health needs are our priority.  As part of our continuing mission to provide you with exceptional heart care, we have created designated Provider Care Teams.  These Care Teams include your primary Cardiologist (physician) and Advanced Practice Providers (APPs -  Physician Assistants and Nurse Practitioners) who all work together to provide you with the care you need, when you need it.  We recommend signing up for the patient portal called "MyChart".  Sign up information is provided on this After Visit Summary.  MyChart is used to connect with patients for Virtual Visits (Telemedicine).  Patients are able to view lab/test results, encounter notes, upcoming appointments, etc.  Non-urgent messages can be sent to your provider as well.   To learn more about what you can do with MyChart, go to  [email or web address].    Your next appointment:   Follow up as needed   The format for your next appointment:   In Person  Provider:   Kate Sable, MD    Other Instructions   Important Information About Sugar         Signed, Kate Sable, MD  06/19/2022 9:45 AM    [place]

## 2022-06-19 NOTE — Patient Instructions (Signed)
Medication Instructions:   Your physician recommends that you continue on your current medications as directed. Please refer to the Current Medication list given to you today.  *If you need a refill on your cardiac medications before your next appointment, please call your pharmacy*    Follow-Up: At [facility], you and your health needs are our priority.  As part of our continuing mission to provide you with exceptional heart care, we have created designated Provider Care Teams.  These Care Teams include your primary Cardiologist (physician) and Advanced Practice Providers (APPs -  Physician Assistants and Nurse Practitioners) who all work together to provide you with the care you need, when you need it.  We recommend signing up for the patient portal called "MyChart".  Sign up information is provided on this After Visit Summary.  MyChart is used to connect with patients for Virtual Visits (Telemedicine).  Patients are able to view lab/test results, encounter notes, upcoming appointments, etc.  Non-urgent messages can be sent to your provider as well.   To learn more about what you can do with MyChart, go to [email or web address].    Your next appointment:   Follow up as needed   The format for your next appointment:   In Person  Provider:   Brian Agbor-Etang, MD    Other Instructions   Important Information About Sugar       

## 2022-09-11 ENCOUNTER — Encounter: Payer: Self-pay | Admitting: Dermatology

## 2022-09-11 ENCOUNTER — Ambulatory Visit: Payer: BC Managed Care – PPO | Admitting: Dermatology

## 2022-09-11 VITALS — BP 126/67 | HR 51

## 2022-09-11 DIAGNOSIS — D229 Melanocytic nevi, unspecified: Secondary | ICD-10-CM

## 2022-09-11 DIAGNOSIS — D225 Melanocytic nevi of trunk: Secondary | ICD-10-CM | POA: Diagnosis not present

## 2022-09-11 DIAGNOSIS — L578 Other skin changes due to chronic exposure to nonionizing radiation: Secondary | ICD-10-CM

## 2022-09-11 DIAGNOSIS — L814 Other melanin hyperpigmentation: Secondary | ICD-10-CM

## 2022-09-11 DIAGNOSIS — Z1283 Encounter for screening for malignant neoplasm of skin: Secondary | ICD-10-CM

## 2022-09-11 DIAGNOSIS — L821 Other seborrheic keratosis: Secondary | ICD-10-CM

## 2022-09-11 DIAGNOSIS — Z86018 Personal history of other benign neoplasm: Secondary | ICD-10-CM

## 2022-09-11 NOTE — Patient Instructions (Signed)
     Melanoma ABCDEs  Melanoma is the most dangerous type of skin cancer, and is the leading cause of death from skin disease.  You are more likely to develop melanoma if you: Have light-colored skin, light-colored eyes, or red or blond hair Spend a lot of time in the sun Tan regularly, either outdoors or in a tanning bed Have had blistering sunburns, especially during childhood Have a close family member who has had a melanoma Have atypical moles or large birthmarks  Early detection of melanoma is key since treatment is typically straightforward and cure rates are extremely high if we catch it early.   The first sign of melanoma is often a change in a mole or a new dark spot.  The ABCDE system is a way of remembering the signs of melanoma.  A for asymmetry:  The two halves do not match. B for border:  The edges of the growth are irregular. C for color:  A mixture of colors are present instead of an even brown color. D for diameter:  Melanomas are usually (but not always) greater than 6mm - the size of a pencil eraser. E for evolution:  The spot keeps changing in size, shape, and color.  Please check your skin once per month between visits. You can use a small mirror in front and a large mirror behind you to keep an eye on the back side or your body.   If you see any new or changing lesions before your next follow-up, please call to schedule a visit.  Please continue daily skin protection including broad spectrum sunscreen SPF 30+ to sun-exposed areas, reapplying every 2 hours as needed when you're outdoors.   Staying in the shade or wearing long sleeves, sun glasses (UVA+UVB protection) and wide brim hats (4-inch brim around the entire circumference of the hat) are also recommended for sun protection.    Due to recent changes in healthcare laws, you may see results of your pathology and/or laboratory studies on MyChart before the doctors have had a chance to review them. We  understand that in some cases there may be results that are confusing or concerning to you. Please understand that not all results are received at the same time and often the doctors may need to interpret multiple results in order to provide you with the best plan of care or course of treatment. Therefore, we ask that you please give us 2 business days to thoroughly review all your results before contacting the office for clarification. Should we see a critical lab result, you will be contacted sooner.   If You Need Anything After Your Visit  If you have any questions or concerns for your doctor, please call our main line at [phone] and press option 4 to reach your doctor's medical assistant. If no one answers, please leave a voicemail as directed and we will return your call as soon as possible. Messages left after 4 pm will be answered the following business day.   You may also send us a message via MyChart. We typically respond to MyChart messages within 1-2 business days.  For prescription refills, please ask your pharmacy to contact our office. Our fax number is [phone].  If you have an urgent issue when the clinic is closed that cannot wait until the next business day, you can page your doctor at the number below.    Please note that while we do our best to be available for urgent issues   outside of office hours, we are not available 24/7.   If you have an urgent issue and are unable to reach us, you may choose to seek medical care at your doctor's office, retail clinic, urgent care center, or emergency room.  If you have a medical emergency, please immediately call 911 or go to the emergency department.  Pager Numbers  - Dr. Kowalski: [phone]  - Dr. Moye: [phone]  - Dr. Stewart: [phone]  In the event of inclement weather, please call our main line at [phone] for an update on the status of any delays or closures.  Dermatology Medication Tips: Please  keep the boxes that topical medications come in in order to help keep track of the instructions about where and how to use these. Pharmacies typically print the medication instructions only on the boxes and not directly on the medication tubes.   If your medication is too expensive, please contact our office at [phone] option 4 or send us a message through MyChart.   We are unable to tell what your co-pay for medications will be in advance as this is different depending on your insurance coverage. However, we may be able to find a substitute medication at lower cost or fill out paperwork to get insurance to cover a needed medication.   If a prior authorization is required to get your medication covered by your insurance company, please allow us 1-2 business days to complete this process.  Drug prices often vary depending on where the prescription is filled and some pharmacies may offer cheaper prices.  The website www.goodrx.com contains coupons for medications through different pharmacies. The prices here do not account for what the cost may be with help from insurance (it may be cheaper with your insurance), but the website can give you the price if you did not use any insurance.  - You can print the associated coupon and take it with your prescription to the pharmacy.  - You may also stop by our office during regular business hours and pick up a GoodRx coupon card.  - If you need your prescription sent electronically to a different pharmacy, notify our office through [facility] MyChart or by phone at [phone] option 4.     Si Usted Necesita Algo Despus de Su Visita  Tambin puede enviarnos un mensaje a travs de MyChart. Por lo general respondemos a los mensajes de MyChart en el transcurso de 1 a 2 das hbiles.  Para renovar recetas, por favor pida a su farmacia que se ponga en contacto con nuestra oficina. Nuestro nmero de fax es el [phone].  Si tiene un asunto urgente  cuando la clnica est cerrada y que no puede esperar hasta el siguiente da hbil, puede llamar/localizar a su doctor(a) al nmero que aparece a continuacin.   Por favor, tenga en cuenta que aunque hacemos todo lo posible para estar disponibles para asuntos urgentes fuera del horario de oficina, no estamos disponibles las 24 horas del da, los 7 das de la semana.   Si tiene un problema urgente y no puede comunicarse con nosotros, puede optar por buscar atencin mdica  en el consultorio de su doctor(a), en una clnica privada, en un centro de atencin urgente o en una sala de emergencias.  Si tiene una emergencia mdica, por favor llame inmediatamente al 911 o vaya a la sala de emergencias.  Nmeros de bper  - Dr. Kowalski: [phone]  - Dra. Moye: [phone]  - Dra. Stewart: [phone]  En caso   de inclemencias del tiempo, por favor llame a nuestra lnea principal al [phone] para una actualizacin sobre el estado de cualquier retraso o cierre.  Consejos para la medicacin en dermatologa: Por favor, guarde las cajas en las que vienen los medicamentos de uso tpico para ayudarle a seguir las instrucciones sobre dnde y cmo usarlos. Las farmacias generalmente imprimen las instrucciones del medicamento slo en las cajas y no directamente en los tubos del medicamento.   Si su medicamento es muy caro, por favor, pngase en contacto con nuestra oficina llamando al [phone] y presione la opcin 4 o envenos un mensaje a travs de MyChart.   No podemos decirle cul ser su copago por los medicamentos por adelantado ya que esto es diferente dependiendo de la cobertura de su seguro. Sin embargo, es posible que podamos encontrar un medicamento sustituto a menor costo o llenar un formulario para que el seguro cubra el medicamento que se considera necesario.   Si se requiere una autorizacin previa para que su compaa de seguros cubra su medicamento, por favor permtanos de 1 a 2  das hbiles para completar este proceso.  Los precios de los medicamentos varan con frecuencia dependiendo del lugar de dnde se surte la receta y alguna farmacias pueden ofrecer precios ms baratos.  El sitio web www.goodrx.com tiene cupones para medicamentos de diferentes farmacias. Los precios aqu no tienen en cuenta lo que podra costar con la ayuda del seguro (puede ser ms barato con su seguro), pero el sitio web puede darle el precio si no utiliz ningn seguro.  - Puede imprimir el cupn correspondiente y llevarlo con su receta a la farmacia.  - Tambin puede pasar por nuestra oficina durante el horario de atencin regular y recoger una tarjeta de cupones de GoodRx.  - Si necesita que su receta se enve electrnicamente a una farmacia diferente, informe a nuestra oficina a travs de MyChart de [facility] o por telfono llamando al [phone] y presione la opcin 4.  

## 2022-09-11 NOTE — Progress Notes (Signed)
   Follow-Up Visit   Subjective  Johnny Palmer is a 66 y.o. male who presents for the following: Annual Exam (1 year tbse, hx of sks).  The patient presents for Total-Body Skin Exam (TBSE) for skin cancer screening and mole check.  The patient has spots, moles and lesions to be evaluated, some may be new or changing and the patient has concerns that these could be cancer.   The following portions of the chart were reviewed this encounter and updated as appropriate:      Review of Systems: No other skin or systemic complaints except as noted in HPI or Assessment and Plan.   Objective  Well appearing patient in no apparent distress; mood and affect are within normal limits.  A full examination was performed including scalp, head, eyes, ears, nose, lips, neck, chest, axillae, abdomen, back, buttocks, bilateral upper extremities, bilateral lower extremities, hands, feet, fingers, toes, fingernails, and toenails. All findings within normal limits unless otherwise noted below.  right abdomen 9.0 mm brown flat papule   left top of shoulder 3.5 mm stellate medium dark brown macule   Assessment & Plan  Nevus right abdomen  Benign-appearing. Stable compared to previous visit. Observation.  Call clinic for new or changing moles.  Recommend daily use of broad spectrum spf 30+ sunscreen to sun-exposed areas.    Lentigo left top of shoulder  Benign-appearing. Stable compared to previous visit. Observation.  Call clinic for new or changing moles.  Recommend daily use of broad spectrum spf 30+ sunscreen to sun-exposed areas.    Lentigines - Scattered tan macules - Due to sun exposure - Benign-appearing, observe - Recommend daily broad spectrum sunscreen SPF 30+ to sun-exposed areas, reapply every 2 hours as needed. - Call for any changes  Seborrheic Keratoses - Stuck-on, waxy, tan-brown papules and/or plaques  - Benign-appearing - Discussed benign etiology and prognosis. -  Observe - Call for any changes  Melanocytic Nevi - Tan-brown and/or pink-flesh-colored symmetric macules and papules - Benign appearing on exam today - Observation - Call clinic for new or changing moles - Recommend daily use of broad spectrum spf 30+ sunscreen to sun-exposed areas.   Hemangiomas - Red papules - Discussed benign nature - Observe - Call for any changes  Actinic Damage - Chronic condition, secondary to cumulative UV/sun exposure - diffuse scaly erythematous macules with underlying dyspigmentation - Recommend daily broad spectrum sunscreen SPF 30+ to sun-exposed areas, reapply every 2 hours as needed.  - Staying in the shade or wearing long sleeves, sun glasses (UVA+UVB protection) and wide brim hats (4-inch brim around the entire circumference of the hat) are also recommended for sun protection.  - Call for new or changing lesions.  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  - Call if any new or changing lesions are noted between office visits  Skin cancer screening performed today. Return in about 1 year (around 09/12/2023) for TBSE.  I, Ruthell Rummage, CMA, am acting as scribe for Brendolyn Patty, MD.  Documentation: I have reviewed the above documentation for accuracy and completeness, and I agree with the above.  Brendolyn Patty MD

## 2022-10-08 ENCOUNTER — Other Ambulatory Visit: Payer: Self-pay | Admitting: *Deleted

## 2022-10-08 MED ORDER — LOSARTAN POTASSIUM 100 MG PO TABS: 100.0000 mg | ORAL_TABLET | Freq: Every day | ORAL | 3 refills | Status: DC

## 2023-03-15 DIAGNOSIS — I10 Essential (primary) hypertension: Secondary | ICD-10-CM | POA: Diagnosis not present

## 2023-03-15 DIAGNOSIS — Z7982 Long term (current) use of aspirin: Secondary | ICD-10-CM | POA: Diagnosis not present

## 2023-03-29 ENCOUNTER — Encounter: Payer: Self-pay | Admitting: Family Medicine

## 2023-05-21 ENCOUNTER — Ambulatory Visit (INDEPENDENT_AMBULATORY_CARE_PROVIDER_SITE_OTHER): Payer: PPO | Admitting: Family Medicine

## 2023-05-21 ENCOUNTER — Encounter: Payer: Self-pay | Admitting: Family Medicine

## 2023-05-21 VITALS — BP 138/78 | HR 48 | Ht 71.0 in | Wt 145.0 lb

## 2023-05-21 DIAGNOSIS — Z125 Encounter for screening for malignant neoplasm of prostate: Secondary | ICD-10-CM | POA: Insufficient documentation

## 2023-05-21 DIAGNOSIS — Z23 Encounter for immunization: Secondary | ICD-10-CM | POA: Insufficient documentation

## 2023-05-21 DIAGNOSIS — R7303 Prediabetes: Secondary | ICD-10-CM | POA: Insufficient documentation

## 2023-05-21 DIAGNOSIS — R001 Bradycardia, unspecified: Secondary | ICD-10-CM

## 2023-05-21 DIAGNOSIS — Z0000 Encounter for general adult medical examination without abnormal findings: Secondary | ICD-10-CM | POA: Insufficient documentation

## 2023-05-21 DIAGNOSIS — I10 Essential (primary) hypertension: Secondary | ICD-10-CM

## 2023-05-21 NOTE — Progress Notes (Signed)
New patient visit   Patient: Johnny Palmer   DOB: Aug 28, 1956   67 y.o. Male  MRN: [id number] Visit Date: 05/21/2023  Today's healthcare provider: Jacky Kindle, FNP  Patient presents for new patient visit to establish care.  Introduced to Publishing rights manager role and practice setting.  All questions answered.  Discussed provider/patient relationship and expectations.   Chief Complaint  Patient presents with   New Patient (Initial Visit)   Annual Exam   Subjective    Johnny Palmer is a 67 y.o. male who presents today as a new patient to establish care.  HPI   Past Medical History:  Diagnosis Date   Dysplastic nevus 03/17/2018   spinal mid back - moderate   Hypertension    on 100 mg losartan   Osteoporosis    PVC (premature ventricular contraction)    history of PVC transcribed from [facility] Record   Past Surgical History:  Procedure Laterality Date   TONSILLECTOMY  1970   Family Status  Relation Name Status   Mother Nicole Cella Deceased at age 68       rhematic heart disease   Father Foy [place], age 2y   Sister  Alive       none   Brother  Alive       none   Mat Aunt  Deceased   Pat Aunt  Deceased       none known   MGM  Deceased at age 50       diabetes   MGF  Deceased at age 45       diabetes   PGM  Deceased at age 66       hardening of the arteries, demenstia,    PGF  Deceased  No partnership data on file   Family History  Problem Relation Age of Onset   Heart disease Mother    Hyperlipidemia Father    Diabetes Father    Heart attack Paternal Grandfather    Social History   Socioeconomic History   Marital status: Married    Spouse name: Not on file   Number of children: Not on file   Years of education: Not on file   Highest education level: Doctorate  Occupational History   Not on file  Tobacco Use   Smoking status: Never   Smokeless tobacco: Never  Vaping Use   Vaping status: Never Used  Substance and Sexual Activity    Alcohol use: Yes    Alcohol/week: 3.0 standard drinks of alcohol    Types: 3 Cans of beer per week    Comment: social per week   Drug use: No   Sexual activity: Yes    Birth control/protection: Condom  Other Topics Concern   Not on file  Social History Narrative   Not on file   Social Determinants of Health   Financial Resource Strain: Low Risk  (05/18/2023)   Overall Financial Resource Strain (CARDIA)    Difficulty of Paying Living Expenses: Not hard at all  Food Insecurity: No Food Insecurity (05/18/2023)   Hunger Vital Sign    Worried About Running Out of Food in the Last Year: Never true    Ran Out of Food in the Last Year: Never true  Transportation Needs: No Transportation Needs (05/18/2023)   PRAPARE - Administrator, Civil Service (Medical): No    Lack of Transportation (Non-Medical): No  Physical Activity: Sufficiently Active (05/18/2023)   Exercise Vital Sign  Days of Exercise per Week: 7 days    Minutes of Exercise per Session: 90 min  Stress: No Stress Concern Present (05/18/2023)   [employer] of Occupational Health - Occupational Stress Questionnaire    Feeling of Stress : Only a little  Social Connections: Socially Integrated (05/18/2023)   Social Connection and Isolation Panel [NHANES]    Frequency of Communication with Friends and Family: More than three times a week    Frequency of Social Gatherings with Friends and Family: Twice a week    Attends Religious Services: More than 4 times per year    Active Member of [employer] or Organizations: Yes    Attends Engineer, structural: More than 4 times per year    Marital Status: Married   Outpatient Medications Prior to Visit  Medication Sig   aspirin 81 MG chewable tablet Chew by mouth 2 (two) times a week. Sunday and Wednesday   calcium carbonate (OSCAL) 1500 (600 Ca) MG TABS tablet Take 600 mg of elemental calcium by mouth daily.   cholecalciferol (VITAMIN D3) 25 MCG (1000 UT) tablet Take  1,000 Units by mouth daily.   losartan (COZAAR) 100 MG tablet Take 1 tablet (100 mg total) by mouth daily.   No facility-administered medications prior to visit.   No Known Allergies  Immunization History  Administered Date(s) Administered   Influenza,inj,Quad PF,6+ Mos 06/17/2019   Influenza-Unspecified 07/16/2017, 06/19/2018, 06/15/2020   PFIZER Comirnaty(Gray Top)Covid-19 Tri-Sucrose Vaccine 10/11/2019, 11/03/2019   Pneumococcal Conjugate-13 05/21/2019   Tdap 04/08/2019    Health Maintenance  Topic Date Due   Medicare Annual Wellness (AWV)  Never done   Zoster Vaccines- Shingrix (1 of 2) Never done   COVID-19 Vaccine (3 - Pfizer risk series) 12/01/2019   Pneumonia Vaccine 69+ Years old (2 of 2 - PPSV23 or PCV20) 05/20/2020   INFLUENZA VACCINE  12/23/2023 (Originally 04/25/2023)   DTaP/Tdap/Td (2 - Td or Tdap) 04/07/2029   Colonoscopy  10/26/2031   Hepatitis C Screening  Completed   HPV VACCINES  Aged Out    Patient Care Team: Jacky Kindle, FNP as PCP - General (Family Medicine)   Objective    BP 138/78 Comment: home readings  Pulse (!) 48 Comment: home reading  SpO2 98%   Physical Exam Vitals and nursing note reviewed.  Constitutional:      General: He is awake. He is not in acute distress.    Appearance: Normal appearance. He is well-developed, well-groomed and normal weight. He is not ill-appearing, toxic-appearing or diaphoretic.  HENT:     Head: Normocephalic and atraumatic.     Jaw: There is normal jaw occlusion. No trismus, tenderness, swelling or pain on movement.     Salivary Glands: Right salivary gland is not diffusely enlarged or tender. Left salivary gland is not diffusely enlarged or tender.     Right Ear: Hearing, tympanic membrane, ear canal and external ear normal. There is no impacted cerumen.     Left Ear: Hearing, tympanic membrane, ear canal and external ear normal. There is no impacted cerumen.     Nose: Nose normal. No congestion or  rhinorrhea.     Right Turbinates: Not enlarged, swollen or pale.     Left Turbinates: Not enlarged, swollen or pale.     Right Sinus: No maxillary sinus tenderness or frontal sinus tenderness.     Left Sinus: No maxillary sinus tenderness or frontal sinus tenderness.     Mouth/Throat:     Lips: Pink.  Mouth: Mucous membranes are moist. No injury, lacerations, oral lesions or angioedema.     Pharynx: Oropharynx is clear. Uvula midline. No pharyngeal swelling, oropharyngeal exudate or posterior oropharyngeal erythema.     Tonsils: No tonsillar exudate or tonsillar abscesses.  Eyes:     General: Lids are normal. Vision grossly intact. Gaze aligned appropriately.        Right eye: No discharge.        Left eye: No discharge.     Extraocular Movements: Extraocular movements intact.     Conjunctiva/sclera: Conjunctivae normal.     Pupils: Pupils are equal, round, and reactive to light.  Neck:     Thyroid: No thyroid mass, thyromegaly or thyroid tenderness.     Vascular: No carotid bruit.     Trachea: Trachea normal. No tracheal tenderness.  Cardiovascular:     Rate and Rhythm: Normal rate and regular rhythm.     Pulses: Normal pulses.          Carotid pulses are 2+ on the right side and 2+ on the left side.      Radial pulses are 2+ on the right side and 2+ on the left side.       Femoral pulses are 2+ on the right side and 2+ on the left side.      Popliteal pulses are 2+ on the right side and 2+ on the left side.       Dorsalis pedis pulses are 2+ on the right side and 2+ on the left side.       Posterior tibial pulses are 2+ on the right side and 2+ on the left side.     Heart sounds: Normal heart sounds, S1 normal and S2 normal. No murmur heard.    No friction rub. No gallop.  Pulmonary:     Effort: Pulmonary effort is normal. No respiratory distress.     Breath sounds: Normal breath sounds and air entry. No stridor. No wheezing, rhonchi or rales.  Chest:     Chest wall: No  tenderness.  Abdominal:     General: Abdomen is flat. Bowel sounds are normal. There is no distension.     Palpations: Abdomen is soft. There is no mass.     Tenderness: There is no abdominal tenderness. There is no guarding or rebound.     Hernia: No hernia is present.  Genitourinary:    Comments: Exam deferred; denies complaints Prostate cancer hx in father; reports elevated PSA in past iso of bike riding Musculoskeletal:        General: No swelling, tenderness, deformity or signs of injury. Normal range of motion.     Cervical back: Normal range of motion and neck supple. No rigidity or tenderness.     Right lower leg: No edema.     Left lower leg: No edema.  Lymphadenopathy:     Cervical: No cervical adenopathy.     Right cervical: No superficial, deep or posterior cervical adenopathy.    Left cervical: No superficial, deep or posterior cervical adenopathy.  Skin:    General: Skin is warm and dry.     Capillary Refill: Capillary refill takes less than 2 seconds.     Coloration: Skin is not jaundiced or pale.     Findings: No bruising, erythema, lesion or rash.  Neurological:     General: No focal deficit present.     Mental Status: He is alert and oriented to person, place, and time. Mental status is  at baseline.     GCS: GCS eye subscore is 4. GCS verbal subscore is 5. GCS motor subscore is 6.     Sensory: Sensation is intact. No sensory deficit.     Motor: Motor function is intact. No weakness.     Coordination: Coordination is intact.     Gait: Gait is intact.  Psychiatric:        Attention and Perception: Attention and perception normal.        Mood and Affect: Mood and affect normal.        Speech: Speech normal.        Behavior: Behavior normal. Behavior is cooperative.        Thought Content: Thought content normal.        Cognition and Memory: Cognition normal.        Judgment: Judgment normal.    Depression Screen    05/21/2023   10:58 AM  PHQ 2/9 Scores   Exception Documentation Patient refusal   No results found for any visits on 05/21/23.  Assessment & Plan      Problem List Items Addressed This Visit       Cardiovascular and Mediastinum   Primary hypertension    Chronic, stable at home per pt report Continue losartan 100 mg as prescribed by cardiology Goal <140/<90        Other   Annual physical exam - Primary    Things to do to keep yourself healthy  - Exercise at least 30-45 minutes a day, 3-4 days a week.  - Eat a low-fat diet with lots of fruits and vegetables, up to 7-9 servings per day.  - Seatbelts can save your life. Wear them always.  - Smoke detectors on every level of your home, check batteries every year.  - Eye Doctor - have an eye exam every 1-2 years  - Safe sex - if you may be exposed to STDs, use a condom.  - Alcohol -  If you drink, do it moderately, less than 2 drinks per day.  - Health Care Power of Attorney. Choose someone to speak for you if you are not able.  - Depression is common in our stressful world.If you're feeling down or losing interest in things you normally enjoy, please come in for a visit.  - Violence - If anyone is threatening or hurting you, please call immediately. 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 Smaller meal size, adding snacks. Choosing water as drink of choice and increasing purposeful exercise. Repeat A1c Not on medication; controlled with diet/exercise       Relevant Orders    Hemoglobin A1c   Screening for prostate cancer    Denies LUTS; recommend PSA in place of DRE. If PSA is elevated for age, we will repeat; if PSA remains elevated pt will be referred to urology for DRE and next steps for best treatment.  Relevant Orders   PSA   Return in about 1 year (around 05/20/2024) for annual examination.    Leilani Merl, FNP, have reviewed all documentation for this visit. The documentation on 05/21/23 for the exam, diagnosis, procedures, and orders are all accurate and complete.  Jacky Kindle, FNP  [facility] [phone] (phone) [phone] (fax)  [facility]

## 2023-05-21 NOTE — Assessment & Plan Note (Signed)
Chronic, stable at home per pt report Continue losartan 100 mg as prescribed by cardiology Goal <140/<90

## 2023-05-21 NOTE — Assessment & Plan Note (Signed)
Continue to recommend balanced, lower carb meals. Smaller meal size, adding snacks. Choosing water as drink of choice and increasing purposeful exercise. Repeat A1c Not on medication; controlled with diet/exercise

## 2023-05-21 NOTE — Assessment & Plan Note (Signed)
Chronic, no complaints Followed by cardiology Continue to monitor

## 2023-05-21 NOTE — Assessment & Plan Note (Signed)

## 2023-05-21 NOTE — Assessment & Plan Note (Signed)
Denies LUTS; recommend PSA in place of DRE. If PSA is elevated for age, we will repeat; if PSA remains elevated pt will be referred to urology for DRE and next steps for best treatment.  

## 2023-05-22 LAB — CBC WITH DIFFERENTIAL/PLATELET
Basophils Absolute: 0.1 10*3/uL (ref 0.0–0.2)
Basos: 1 %
EOS (ABSOLUTE): 0.1 10*3/uL (ref 0.0–0.4)
Eos: 3 %
Hematocrit: 45.1 % (ref 37.5–51.0)
Hemoglobin: 15.3 g/dL (ref 13.0–17.7)
Immature Grans (Abs): 0 10*3/uL (ref 0.0–0.1)
Immature Granulocytes: 0 %
Lymphocytes Absolute: 1.3 10*3/uL (ref 0.7–3.1)
Lymphs: 26 %
MCH: 32.1 pg (ref 26.6–33.0)
MCHC: 33.9 g/dL (ref 31.5–35.7)
MCV: 95 fL (ref 79–97)
Monocytes Absolute: 0.6 10*3/uL (ref 0.1–0.9)
Monocytes: 11 %
Neutrophils Absolute: 3 10*3/uL (ref 1.4–7.0)
Neutrophils: 59 %
Platelets: 275 10*3/uL (ref 150–450)
RBC: 4.76 x10E6/uL (ref 4.14–5.80)
RDW: 11.8 % (ref 11.6–15.4)
WBC: 5 10*3/uL (ref 3.4–10.8)

## 2023-05-22 LAB — COMPREHENSIVE METABOLIC PANEL
ALT: 13 IU/L (ref 0–44)
AST: 16 IU/L (ref 0–40)
Albumin: 4.5 g/dL (ref 3.9–4.9)
Alkaline Phosphatase: 75 IU/L (ref 44–121)
BUN/Creatinine Ratio: 21 (ref 10–24)
BUN: 23 mg/dL (ref 8–27)
Bilirubin Total: 2.1 mg/dL — ABNORMAL HIGH (ref 0.0–1.2)
CO2: 24 mmol/L (ref 20–29)
Calcium: 10.3 mg/dL — ABNORMAL HIGH (ref 8.6–10.2)
Chloride: 103 mmol/L (ref 96–106)
Creatinine, Ser: 1.08 mg/dL (ref 0.76–1.27)
Globulin, Total: 2.3 g/dL (ref 1.5–4.5)
Glucose: 106 mg/dL — ABNORMAL HIGH (ref 70–99)
Potassium: 4.8 mmol/L (ref 3.5–5.2)
Sodium: 139 mmol/L (ref 134–144)
Total Protein: 6.8 g/dL (ref 6.0–8.5)
eGFR: 74 mL/min/{1.73_m2} (ref >59)

## 2023-05-22 LAB — LIPID PANEL
Chol/HDL Ratio: 2.3 ratio (ref 0.0–5.0)
Cholesterol, Total: 147 mg/dL (ref 100–199)
HDL: 64 mg/dL (ref >39)
LDL Chol Calc (NIH): 73 mg/dL (ref 0–99)
Triglycerides: 44 mg/dL (ref 0–149)
VLDL Cholesterol Cal: 10 mg/dL (ref 5–40)

## 2023-05-22 LAB — HEMOGLOBIN A1C
Est. average glucose Bld gHb Est-mCnc: 120 mg/dL
Hgb A1c MFr Bld: 5.8 % — ABNORMAL HIGH (ref 4.8–5.6)

## 2023-05-22 LAB — TSH: TSH: 1.15 u[IU]/mL (ref 0.450–4.500)

## 2023-05-22 LAB — PSA: Prostate Specific Ag, Serum: 3.7 ng/mL (ref 0.0–4.0)

## 2023-09-10 ENCOUNTER — Ambulatory Visit: Payer: PPO | Admitting: Dermatology

## 2023-09-10 DIAGNOSIS — W908XXA Exposure to other nonionizing radiation, initial encounter: Secondary | ICD-10-CM

## 2023-09-10 DIAGNOSIS — D229 Melanocytic nevi, unspecified: Secondary | ICD-10-CM | POA: Diagnosis not present

## 2023-09-10 DIAGNOSIS — D225 Melanocytic nevi of trunk: Secondary | ICD-10-CM | POA: Diagnosis not present

## 2023-09-10 DIAGNOSIS — D1801 Hemangioma of skin and subcutaneous tissue: Secondary | ICD-10-CM

## 2023-09-10 DIAGNOSIS — L814 Other melanin hyperpigmentation: Secondary | ICD-10-CM | POA: Diagnosis not present

## 2023-09-10 DIAGNOSIS — L578 Other skin changes due to chronic exposure to nonionizing radiation: Secondary | ICD-10-CM

## 2023-09-10 DIAGNOSIS — R21 Rash and other nonspecific skin eruption: Secondary | ICD-10-CM | POA: Diagnosis not present

## 2023-09-10 DIAGNOSIS — Z86018 Personal history of other benign neoplasm: Secondary | ICD-10-CM

## 2023-09-10 DIAGNOSIS — L821 Other seborrheic keratosis: Secondary | ICD-10-CM | POA: Diagnosis not present

## 2023-09-10 DIAGNOSIS — Z1283 Encounter for screening for malignant neoplasm of skin: Secondary | ICD-10-CM

## 2023-09-10 NOTE — Patient Instructions (Addendum)

## 2023-09-10 NOTE — Progress Notes (Signed)
   Follow-Up Visit   Subjective  Johnny Palmer is a 67 y.o. male who presents for the following: Skin Cancer Screening and Full Body Skin Exam  The patient presents for Total-Body Skin Exam (TBSE) for skin cancer screening and mole check. The patient has spots, moles and lesions to be evaluated, some may be new or changing. 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 Abdomen itchy at times.    The following portions of the chart were reviewed this encounter and updated as appropriate: medications, allergies, medical history  Review of Systems:  No other skin or systemic complaints except as noted in HPI or Assessment and Plan.  Objective  Well appearing patient in no apparent distress; mood and affect are within normal limits.  A full examination was performed including scalp, head, eyes, ears, nose, lips, neck, chest, axillae, abdomen, back, buttocks, bilateral upper extremities, bilateral lower extremities, hands, feet, fingers, toes, fingernails, and toenails. All findings within normal limits unless otherwise noted below.   Relevant physical exam findings are noted in the Assessment and Plan.    Assessment & Plan   SKIN CANCER SCREENING PERFORMED TODAY.  ACTINIC DAMAGE - Chronic condition, secondary to cumulative UV/sun exposure - diffuse scaly erythematous macules with underlying dyspigmentation - Recommend daily broad spectrum sunscreen SPF 30+ to sun-exposed areas, reapply every 2 hours as needed.  - Staying in the shade or wearing long sleeves, sun glasses (UVA+UVB protection) and wide brim hats (4-inch brim around the entire circumference of the hat) are also recommended for sun protection.  - Call for new or changing lesions.  LENTIGINES, SEBORRHEIC KERATOSES (including R post ear), HEMANGIOMAS - Benign normal skin lesions - Benign-appearing - Call for any changes  MELANOCYTIC NEVI - Tan-brown and/or pink-flesh-colored symmetric macules and  papules - right abdomen 9.0 mm brown flat papule, stable  - Benign appearing on exam today - Observation - Call clinic for new or changing moles - Recommend daily use of broad spectrum spf 30+ sunscreen to sun-exposed areas.   LENTIGO vs FLAT SK Exam: left top of shoulder 4.0 mm stellate medium dark brown macule, stable  Due to sun exposure Treatment Plan: Benign-appearing, observe. Recommend daily broad spectrum sunscreen SPF 30+ to sun-exposed areas, reapply every 2 hours as needed.  Call for any changes  History of Dysplastic Nevus Spinal mid back, moderate 2019 - No evidence of recurrence today - Recommend regular full body skin exams - Recommend daily broad spectrum sunscreen SPF 30+ to sun-exposed areas, reapply every 2 hours as needed.  - Call if any new or changing lesions are noted between office visits  Rash Exam: Pink papules on the upper abdomen. Occasional itch Rash comes up with season changes, but clears.  Treatment Plan: Patient defers Rx treatment.  Recommend mild soap and moisturizing cream 1-2 times daily.  Gentle skin care handout provided.    Return in about 1 year (around 09/09/2024) for TBSE, Hx Dysplastic Nevus.  ICherlyn Labella, CMA, am acting as scribe for Willeen Niece, MD .   Documentation: I have reviewed the above documentation for accuracy and completeness, and I agree with the above.  Willeen Niece, MD

## 2023-09-26 ENCOUNTER — Other Ambulatory Visit: Payer: Self-pay

## 2023-09-26 MED ORDER — LOSARTAN POTASSIUM 100 MG PO TABS: 100.0000 mg | ORAL_TABLET | Freq: Every day | ORAL | 0 refills | Status: DC

## 2023-12-31 ENCOUNTER — Other Ambulatory Visit: Payer: Self-pay | Admitting: Cardiology

## 2023-12-31 MED ORDER — LOSARTAN POTASSIUM 100 MG PO TABS: 100.0000 mg | ORAL_TABLET | Freq: Every day | ORAL | 0 refills | Status: DC

## 2024-03-17 DIAGNOSIS — J00 Acute nasopharyngitis [common cold]: Secondary | ICD-10-CM | POA: Diagnosis not present

## 2024-03-17 DIAGNOSIS — I10 Essential (primary) hypertension: Secondary | ICD-10-CM | POA: Diagnosis not present

## 2024-03-17 DIAGNOSIS — J309 Allergic rhinitis, unspecified: Secondary | ICD-10-CM | POA: Diagnosis not present

## 2024-03-17 DIAGNOSIS — J029 Acute pharyngitis, unspecified: Secondary | ICD-10-CM | POA: Diagnosis not present

## 2024-03-17 DIAGNOSIS — Z6820 Body mass index (BMI) 20.0-20.9, adult: Secondary | ICD-10-CM | POA: Diagnosis not present

## 2024-03-27 ENCOUNTER — Other Ambulatory Visit: Payer: Self-pay | Admitting: Cardiology

## 2024-04-22 ENCOUNTER — Other Ambulatory Visit: Payer: Self-pay | Admitting: Cardiology

## 2024-04-30 ENCOUNTER — Other Ambulatory Visit: Payer: Self-pay | Admitting: Cardiology

## 2024-05-24 ENCOUNTER — Other Ambulatory Visit: Payer: Self-pay | Admitting: Cardiology

## 2024-06-01 ENCOUNTER — Other Ambulatory Visit: Payer: Self-pay | Admitting: Cardiology

## 2024-06-11 DIAGNOSIS — R7303 Prediabetes: Secondary | ICD-10-CM | POA: Diagnosis not present

## 2024-06-11 DIAGNOSIS — Z1331 Encounter for screening for depression: Secondary | ICD-10-CM | POA: Diagnosis not present

## 2024-06-11 DIAGNOSIS — I10 Essential (primary) hypertension: Secondary | ICD-10-CM | POA: Diagnosis not present

## 2024-06-11 DIAGNOSIS — Z23 Encounter for immunization: Secondary | ICD-10-CM | POA: Diagnosis not present

## 2024-06-11 DIAGNOSIS — B351 Tinea unguium: Secondary | ICD-10-CM | POA: Diagnosis not present

## 2024-06-11 DIAGNOSIS — Z125 Encounter for screening for malignant neoplasm of prostate: Secondary | ICD-10-CM | POA: Diagnosis not present

## 2024-06-11 DIAGNOSIS — Z0000 Encounter for general adult medical examination without abnormal findings: Secondary | ICD-10-CM | POA: Diagnosis not present

## 2024-06-16 DIAGNOSIS — L600 Ingrowing nail: Secondary | ICD-10-CM | POA: Diagnosis not present

## 2024-06-16 DIAGNOSIS — M79675 Pain in left toe(s): Secondary | ICD-10-CM | POA: Diagnosis not present

## 2024-06-16 DIAGNOSIS — M79674 Pain in right toe(s): Secondary | ICD-10-CM | POA: Diagnosis not present

## 2024-06-16 DIAGNOSIS — B351 Tinea unguium: Secondary | ICD-10-CM | POA: Diagnosis not present

## 2024-06-16 DIAGNOSIS — L03031 Cellulitis of right toe: Secondary | ICD-10-CM | POA: Diagnosis not present

## 2024-07-14 DIAGNOSIS — R972 Elevated prostate specific antigen [PSA]: Secondary | ICD-10-CM | POA: Diagnosis not present

## 2024-09-14 ENCOUNTER — Encounter: Payer: PPO | Admitting: Dermatology

## 2024-09-22 ENCOUNTER — Ambulatory Visit: Admitting: Dermatology

## 2024-09-22 DIAGNOSIS — Z1283 Encounter for screening for malignant neoplasm of skin: Secondary | ICD-10-CM

## 2024-09-22 DIAGNOSIS — D225 Melanocytic nevi of trunk: Secondary | ICD-10-CM

## 2024-09-22 DIAGNOSIS — L578 Other skin changes due to chronic exposure to nonionizing radiation: Secondary | ICD-10-CM

## 2024-09-22 DIAGNOSIS — L814 Other melanin hyperpigmentation: Secondary | ICD-10-CM

## 2024-09-22 DIAGNOSIS — W908XXA Exposure to other nonionizing radiation, initial encounter: Secondary | ICD-10-CM

## 2024-09-22 DIAGNOSIS — L821 Other seborrheic keratosis: Secondary | ICD-10-CM | POA: Diagnosis not present

## 2024-09-22 DIAGNOSIS — L989 Disorder of the skin and subcutaneous tissue, unspecified: Secondary | ICD-10-CM | POA: Diagnosis not present

## 2024-09-22 DIAGNOSIS — Z86018 Personal history of other benign neoplasm: Secondary | ICD-10-CM | POA: Diagnosis not present

## 2024-09-22 DIAGNOSIS — L853 Xerosis cutis: Secondary | ICD-10-CM | POA: Diagnosis not present

## 2024-09-22 DIAGNOSIS — L309 Dermatitis, unspecified: Secondary | ICD-10-CM

## 2024-09-22 DIAGNOSIS — D229 Melanocytic nevi, unspecified: Secondary | ICD-10-CM

## 2024-09-22 DIAGNOSIS — D1801 Hemangioma of skin and subcutaneous tissue: Secondary | ICD-10-CM

## 2024-09-22 MED ORDER — TRIAMCINOLONE ACETONIDE 0.1 % EX CREA: TOPICAL_CREAM | CUTANEOUS | 1 refills | Status: AC

## 2024-09-22 NOTE — Patient Instructions (Addendum)
 Topical steroids (such as triamcinolone, fluocinolone, fluocinonide, mometasone, clobetasol, halobetasol, betamethasone, hydrocortisone) can cause thinning and lightening of the skin if they are used for too long in the same area. Your physician has selected the right strength medicine for your problem and area affected on the body. Please use your medication only as directed by your physician to prevent side effects.   Gentle Skin Care Guide  1. Bathe no more than once a day.  2. Avoid bathing in hot water  3. Use a mild soap like Dove, Vanicream, Cetaphil, CeraVe. Can use Lever 2000 or Cetaphil antibacterial soap  4. Use soap only where you need it. On most days, use it under your arms, between your legs, and on your feet. Let the water rinse other areas unless visibly dirty.  5. When you get out of the bath/shower, use a towel to gently blot your skin dry, don't rub it.  6. While your skin is still a little damp, apply a moisturizing cream such as Vanicream, CeraVe, Cetaphil, Eucerin, Sarna lotion or plain Vaseline Jelly. For hands apply Neutrogena Norwegian Hand Cream or Excipial Hand Cream.  7. Reapply moisturizer any time you start to itch or feel dry.  8. Sometimes using free and clear laundry detergents can be helpful. Fabric softener sheets should be avoided. Downy Free & Gentle liquid, or any liquid fabric softener that is free of dyes and perfumes, it acceptable to use  9. If your doctor has given you prescription creams you may apply moisturizers over them    Melanoma ABCDEs  Melanoma is the most dangerous type of skin cancer, and is the leading cause of death from skin disease.  You are more likely to develop melanoma if you: Have light-colored skin, light-colored eyes, or red or blond hair Spend a lot of time in the sun Tan regularly, either outdoors or in a tanning bed Have had blistering sunburns, especially during childhood Have a close family member who has had a  melanoma Have atypical moles or large birthmarks  Early detection of melanoma is key since treatment is typically straightforward and cure rates are extremely high if we catch it early.   The first sign of melanoma is often a change in a mole or a new dark spot.  The ABCDE system is a way of remembering the signs of melanoma.  A for asymmetry:  The two halves do not match. B for border:  The edges of the growth are irregular. C for color:  A mixture of colors are present instead of an even brown color. D for diameter:  Melanomas are usually (but not always) greater than 6mm - the size of a pencil eraser. E for evolution:  The spot keeps changing in size, shape, and color.  Please check your skin once per month between visits. You can use a small mirror in front and a large mirror behind you to keep an eye on the back side or your body.   If you see any new or changing lesions before your next follow-up, please call to schedule a visit.  Please continue daily skin protection including broad spectrum sunscreen SPF 30+ to sun-exposed areas, reapplying every 2 hours as needed when you're outdoors.   Staying in the shade or wearing long sleeves, sun glasses (UVA+UVB protection) and wide brim hats (4-inch brim around the entire circumference of the hat) are also recommended for sun protection.     Due to recent changes in healthcare laws, you may see  results of your pathology and/or laboratory studies on MyChart before the doctors have had a chance to review them. We understand that in some cases there may be results that are confusing or concerning to you. Please understand that not all results are received at the same time and often the doctors may need to interpret multiple results in order to provide you with the best plan of care or course of treatment. Therefore, we ask that you please give us  2 business days to thoroughly review all your results before contacting the office for clarification.  Should we see a critical lab result, you will be contacted sooner.   If You Need Anything After Your Visit  If you have any questions or concerns for your doctor, please call our main line at [phone] and press option 4 to reach your doctor's medical assistant. If no one answers, please leave a voicemail as directed and we will return your call as soon as possible. Messages left after 4 pm will be answered the following business day.   You may also send us  a message via MyChart. We typically respond to MyChart messages within 1-2 business days.  For prescription refills, please ask your pharmacy to contact our office. Our fax number is [phone].  If you have an urgent issue when the clinic is closed that cannot wait until the next business day, you can page your doctor at the number below.    Please note that while we do our best to be available for urgent issues outside of office hours, we are not available 24/7.   If you have an urgent issue and are unable to reach us , you may choose to seek medical care at your doctor's office, retail clinic, urgent care center, or emergency room.  If you have a medical emergency, please immediately call 911 or go to the emergency department.  Pager Numbers  - Dr. Hester: [phone]  - Dr. Jackquline: [phone]  - Dr. Claudene: [phone]   - Dr. Raymund: [phone]  In the event of inclement weather, please call our main line at [phone] for an update on the status of any delays or closures.  Dermatology Medication Tips: Please keep the boxes that topical medications come in in order to help keep track of the instructions about where and how to use these. Pharmacies typically print the medication instructions only on the boxes and not directly on the medication tubes.   If your medication is too expensive, please contact our office at [phone] option 4 or send us  a message through MyChart.   We are unable to tell what your  co-pay for medications will be in advance as this is different depending on your insurance coverage. However, we may be able to find a substitute medication at lower cost or fill out paperwork to get insurance to cover a needed medication.   If a prior authorization is required to get your medication covered by your insurance company, please allow us  1-2 business days to complete this process.  Drug prices often vary depending on where the prescription is filled and some pharmacies may offer cheaper prices.  The website www.goodrx.com contains coupons for medications through different pharmacies. The prices here do not account for what the cost may be with help from insurance (it may be cheaper with your insurance), but the website can give you the price if you did not use any insurance.  - You can print the associated coupon and take it with your prescription to  the pharmacy.  - You may also stop by our office during regular business hours and pick up a GoodRx coupon card.  - If you need your prescription sent electronically to a different pharmacy, notify our office through [facility] or by phone at [phone] option 4.     Si Usted Necesita Algo Despus de Su Visita  Tambin puede enviarnos un mensaje a travs de Clinical Cytogeneticist. Por lo general respondemos a los mensajes de MyChart en el transcurso de 1 a 2 das hbiles.  Para renovar recetas, por favor pida a su farmacia que se ponga en contacto con nuestra oficina. Randi lakes de fax es [place] [phone].  Si tiene un asunto urgente cuando la clnica est cerrada y que no puede esperar hasta el siguiente da hbil, puede llamar/localizar a su doctor(a) al nmero que aparece a continuacin.   Por favor, tenga en cuenta que aunque hacemos todo lo posible para estar disponibles para asuntos urgentes fuera del horario de [place], no estamos disponibles las 24 horas del da, los 7 [address]  [address] de la [place].   Si tiene un problema urgente y no  puede comunicarse con nosotros, puede optar por buscar atencin mdica  en el consultorio de su doctor(a), en una clnica privada, en un centro de atencin urgente o en una sala de emergencias.  Si tiene engineer, drilling, por favor llame inmediatamente al 911 o vaya a la sala de emergencias.  Nmeros de bper  - Dr. Hester: [phone]  - Dra. Jackquline: [phone]  - Dr. Claudene: [phone]  - Dra. Kitts: [phone]  En caso de inclemencias del [place], por favor llame a nuestra lnea principal al [phone] para una actualizacin sobre el estado de cualquier retraso o cierre.  Consejos para la medicacin en dermatologa: Por favor, guarde las cajas en las que vienen los medicamentos de uso tpico para ayudarle a seguir las instrucciones sobre dnde y cmo usarlos. Las farmacias generalmente imprimen las instrucciones del medicamento slo en las cajas y no directamente en los tubos del [place].   Si su medicamento es muy caro, por favor, pngase en contacto con landry rieger llamando al [phone] y presione la opcin 4 o envenos un mensaje a travs de Clinical Cytogeneticist.   No podemos decirle cul ser su copago por los medicamentos por adelantado ya que esto es diferente dependiendo de la cobertura de su seguro. Sin embargo, es posible que podamos encontrar un medicamento sustituto a audiological scientist un formulario para que el seguro cubra el medicamento que se considera necesario.   Si se requiere una autorizacin previa para que su compaa de seguros cubra su medicamento, por favor permtanos de 1 a 2 das hbiles para completar este proceso.  Los precios de los medicamentos varan con frecuencia dependiendo del environmental consultant de dnde se surte la receta y alguna farmacias pueden ofrecer precios ms baratos.  El sitio web www.goodrx.com tiene cupones para medicamentos de health and safety inspector. Los precios aqu no tienen en cuenta lo que podra costar con la ayuda del seguro (puede  ser ms barato con su seguro), pero el sitio web puede darle el precio si no utiliz tourist information centre manager.  - Puede imprimir el cupn correspondiente y llevarlo con su receta a la farmacia.  - Tambin puede pasar por nuestra oficina durante el horario de atencin regular y education officer, museum una tarjeta de cupones de GoodRx.  - Si necesita que su receta se enve electrnicamente a psychiatrist, informe a nuestra oficina a travs de MyChart de  [place] o por telfono llamando al [phone] y presione la opcin 4.

## 2024-09-22 NOTE — Progress Notes (Signed)
 "  Follow-Up Visit   Subjective  Johnny Palmer is a 68 y.o. male who presents for the following: Skin Cancer Screening and Full Body Skin Exam. History of dysplastic nevus.  The patient presents for Total-Body Skin Exam (TBSE) for skin cancer screening and mole check. The patient has spots, moles and lesions to be evaluated, some may be new or changing. Patient with itchy rash on the chest that comes and goes, started in November, possibly related to sweat. Worsened while patient was on a cruise. Tried otc HC cream.     The following portions of the chart were reviewed this encounter and updated as appropriate: medications, allergies, medical history  Review of Systems:  No other skin or systemic complaints except as noted in HPI or Assessment and Plan.  Objective  Well appearing patient in no apparent distress; mood and affect are within normal limits.  A full examination was performed including scalp, head, eyes, ears, nose, lips, neck, chest, axillae, abdomen, back, buttocks, bilateral upper extremities, bilateral lower extremities, hands, feet, fingers, toes, fingernails, and toenails. All findings within normal limits unless otherwise noted below.   Relevant physical exam findings are noted in the Assessment and Plan.    Assessment & Plan   SKIN CANCER SCREENING PERFORMED TODAY.  ACTINIC DAMAGE - Chronic condition, secondary to cumulative UV/sun exposure - diffuse scaly erythematous macules with underlying dyspigmentation - Recommend daily broad spectrum sunscreen SPF 30+ to sun-exposed areas, reapply every 2 hours as needed.  - Staying in the shade or wearing long sleeves, sun glasses (UVA+UVB protection) and wide brim hats (4-inch brim around the entire circumference of the hat) are also recommended for sun protection.  - Call for new or changing lesions.  LENTIGINES, SEBORRHEIC KERATOSES, HEMANGIOMAS - Benign normal skin lesions - Benign-appearing - Call for any  changes  MELANOCYTIC NEVI - Tan-brown and/or pink-flesh-colored symmetric macules and papules - right abdomen 9 mm brown flat papule, stable  - Benign appearing on exam today - Observation - Call clinic for new or changing moles - Recommend daily use of broad spectrum spf 30+ sunscreen to sun-exposed areas.   LENTIGO vs FLAT SK Exam: left top of shoulder 4.0 mm stellate medium dark brown macule, stable   Due to sun exposure Treatment Plan: Benign-appearing, observe. Recommend daily broad spectrum sunscreen SPF 30+ to sun-exposed areas, reapply every 2 hours as needed.  Call for any changes   History of Dysplastic Nevus Spinal mid back, moderate 2019 - No evidence of recurrence today - Recommend regular full body skin exams - Recommend daily broad spectrum sunscreen SPF 30+ to sun-exposed areas, reapply every 2 hours as needed.  - Call if any new or changing lesions are noted between office visits  Dermatitis Exam: Scaly pink papules coalescing to plaques at chest.  Itchy per pt  Chronic and persistent condition with duration or expected duration over one year. Condition is bothersome/symptomatic for patient. Currently flared.   Treatment Plan: Start TMC 0.1% Cream to aa rash twice a day until improved dsp 80 g 1Rf. Avoid applying to face, groin, and axilla. Use as directed. Long-term use can cause thinning of the skin.  Avoid scented moisturizers/body spray.   Topical steroids (such as triamcinolone, fluocinolone, fluocinonide, mometasone, clobetasol, halobetasol, betamethasone, hydrocortisone) can cause thinning and lightening of the skin if they are used for too long in the same area. Your physician has selected the right strength medicine for your problem and area affected on the body. Please use  your medication only as directed by your physician to prevent side effects.   Xerosis - diffuse xerotic patches - recommend gentle, hydrating skin care - gentle skin care handout  given  Return in about 1 year (around 09/22/2025) for TBSE, Hx Dysplastic Nevus.  IAndrea Kerns, CMA, am acting as scribe for Rexene Rattler, MD .   Documentation: I have reviewed the above documentation for accuracy and completeness, and I agree with the above.  Rexene Rattler, MD    "

## 2025-09-28 ENCOUNTER — Ambulatory Visit: Admitting: Dermatology
